# Patient Record
Sex: Male | Born: 1970
Health system: Southern US, Community
[De-identification: ages and names within clinical notes are randomized; demographics above are authoritative.]

## PROBLEM LIST (undated history)

## (undated) DIAGNOSIS — F329 Major depressive disorder, single episode, unspecified: Secondary | ICD-10-CM

## (undated) DIAGNOSIS — F32A Depression, unspecified: Secondary | ICD-10-CM

## (undated) DIAGNOSIS — F419 Anxiety disorder, unspecified: Secondary | ICD-10-CM

## (undated) HISTORY — DX: Depression, unspecified: F32.A

## (undated) HISTORY — DX: Anxiety disorder, unspecified: F41.9

## (undated) HISTORY — PX: OTHER SURGICAL HISTORY: SHX169

---
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
## 2010-05-27 ENCOUNTER — Observation Stay (HOSPITAL_COMMUNITY)
Admission: EM | Admit: 2010-05-27 | Discharge: 2010-05-29 | Payer: Self-pay | Source: Home / Self Care | Admitting: Emergency Medicine

## 2010-10-26 LAB — CBC
HCT: 40.8 % (ref 39.0–52.0)
Hemoglobin: 13.6 g/dL (ref 13.0–17.0)
MCH: 30.2 pg (ref 26.0–34.0)
MCHC: 33.3 g/dL (ref 30.0–36.0)
MCV: 90.5 fL (ref 78.0–100.0)
Platelets: 246 10*3/uL (ref 150–400)
RBC: 4.51 MIL/uL (ref 4.22–5.81)
RDW: 13.1 % (ref 11.5–15.5)
WBC: 4.8 10*3/uL (ref 4.0–10.5)

## 2010-10-26 LAB — URIC ACID: Uric Acid, Serum: 4.1 mg/dL (ref 4.0–7.8)

## 2010-10-27 LAB — CBC
HCT: 38.4 % — ABNORMAL LOW (ref 39.0–52.0)
Hemoglobin: 13.1 g/dL (ref 13.0–17.0)
MCH: 30.8 pg (ref 26.0–34.0)
MCHC: 34.1 g/dL (ref 30.0–36.0)
MCV: 90.4 fL (ref 78.0–100.0)
Platelets: 253 10*3/uL (ref 150–400)
RBC: 4.25 MIL/uL (ref 4.22–5.81)
RDW: 13.1 % (ref 11.5–15.5)
WBC: 6.8 10*3/uL (ref 4.0–10.5)

## 2010-10-27 LAB — BASIC METABOLIC PANEL
BUN: 13 mg/dL (ref 6–23)
CO2: 26 mEq/L (ref 19–32)
Calcium: 9.2 mg/dL (ref 8.4–10.5)
Chloride: 109 mEq/L (ref 96–112)
Creatinine, Ser: 1.38 mg/dL (ref 0.4–1.5)
GFR calc Af Amer: >60 mL/min (ref >60)
GFR calc non Af Amer: 57 mL/min — ABNORMAL LOW (ref >60)
Glucose, Bld: 97 mg/dL (ref 70–99)
Potassium: 4 mEq/L (ref 3.5–5.1)
Sodium: 141 mEq/L (ref 135–145)

## 2010-10-27 LAB — DIFFERENTIAL
Basophils Absolute: 0 10*3/uL (ref 0.0–0.1)
Basophils Relative: 1 % (ref 0–1)
Eosinophils Absolute: 0.2 10*3/uL (ref 0.0–0.7)
Eosinophils Relative: 2 % (ref 0–5)
Lymphocytes Relative: 26 % (ref 12–46)
Lymphs Abs: 1.7 10*3/uL (ref 0.7–4.0)
Monocytes Absolute: 0.7 10*3/uL (ref 0.1–1.0)
Monocytes Relative: 11 % (ref 3–12)
Neutro Abs: 4.1 10*3/uL (ref 1.7–7.7)
Neutrophils Relative %: 61 % (ref 43–77)

## 2011-08-02 IMAGING — CR DG HAND COMPLETE 3+V*R*
3 series · 3 of 3 positions shown · non-contrast
Comparison: None.

CLINICAL DATA: Abrasion with cellulitis at first through third
digits

RIGHT HAND - COMPLETE 3+ VIEW

[x hand pa right]
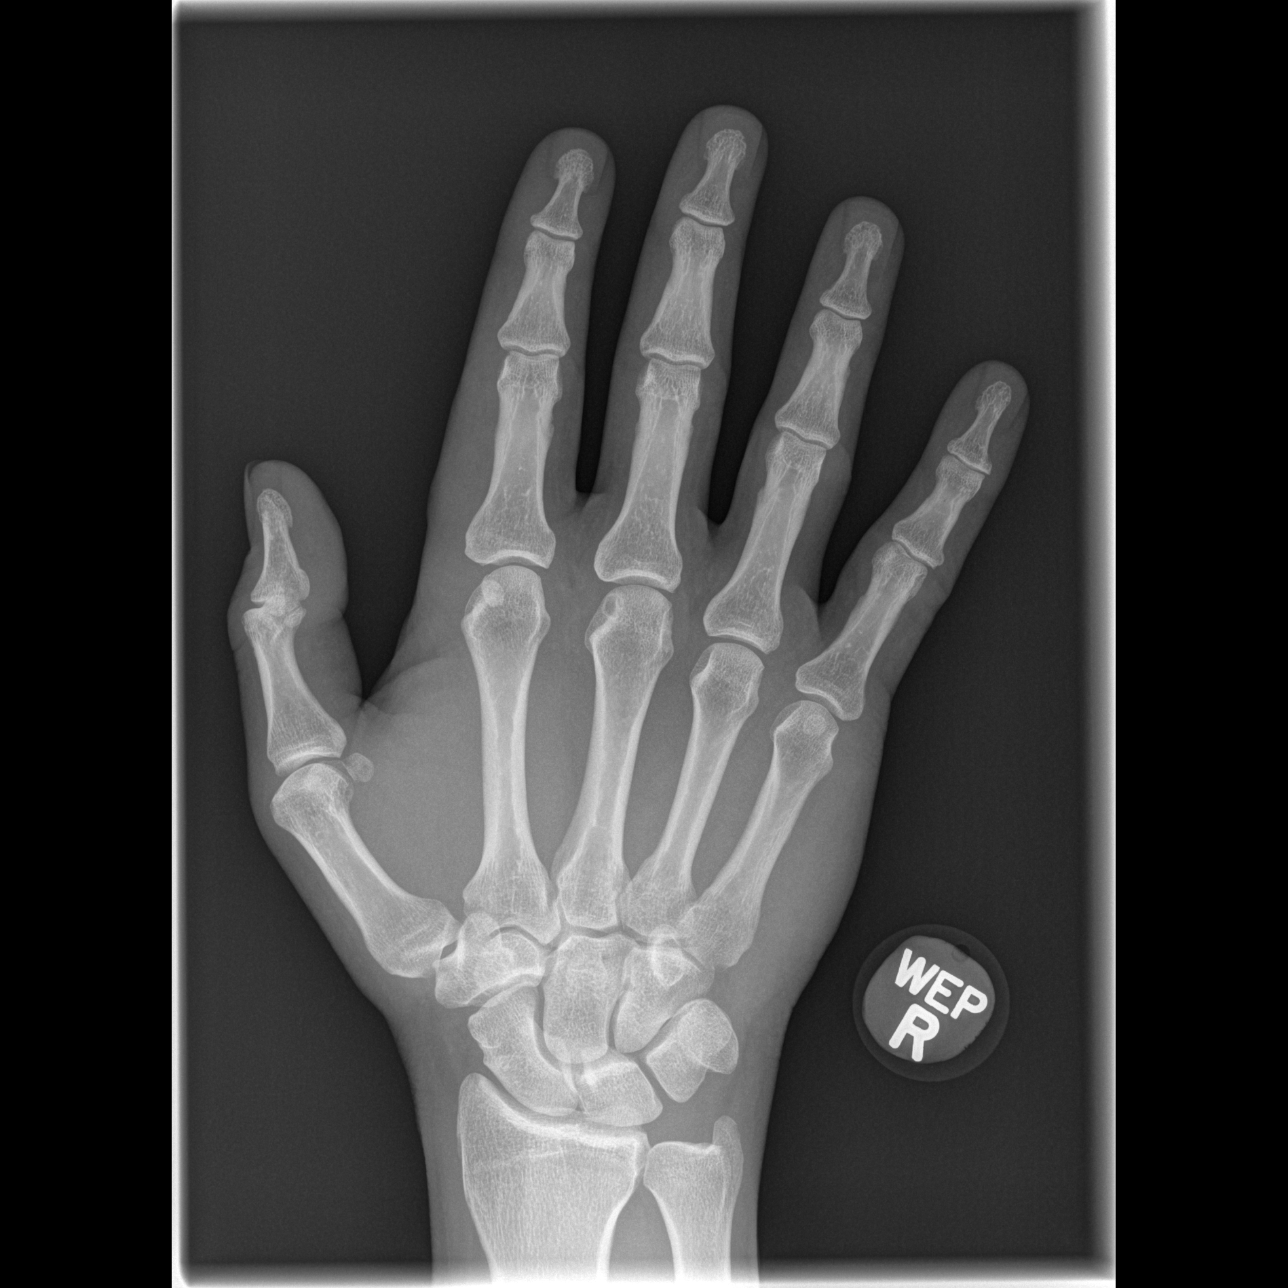

[x hand oblique right]
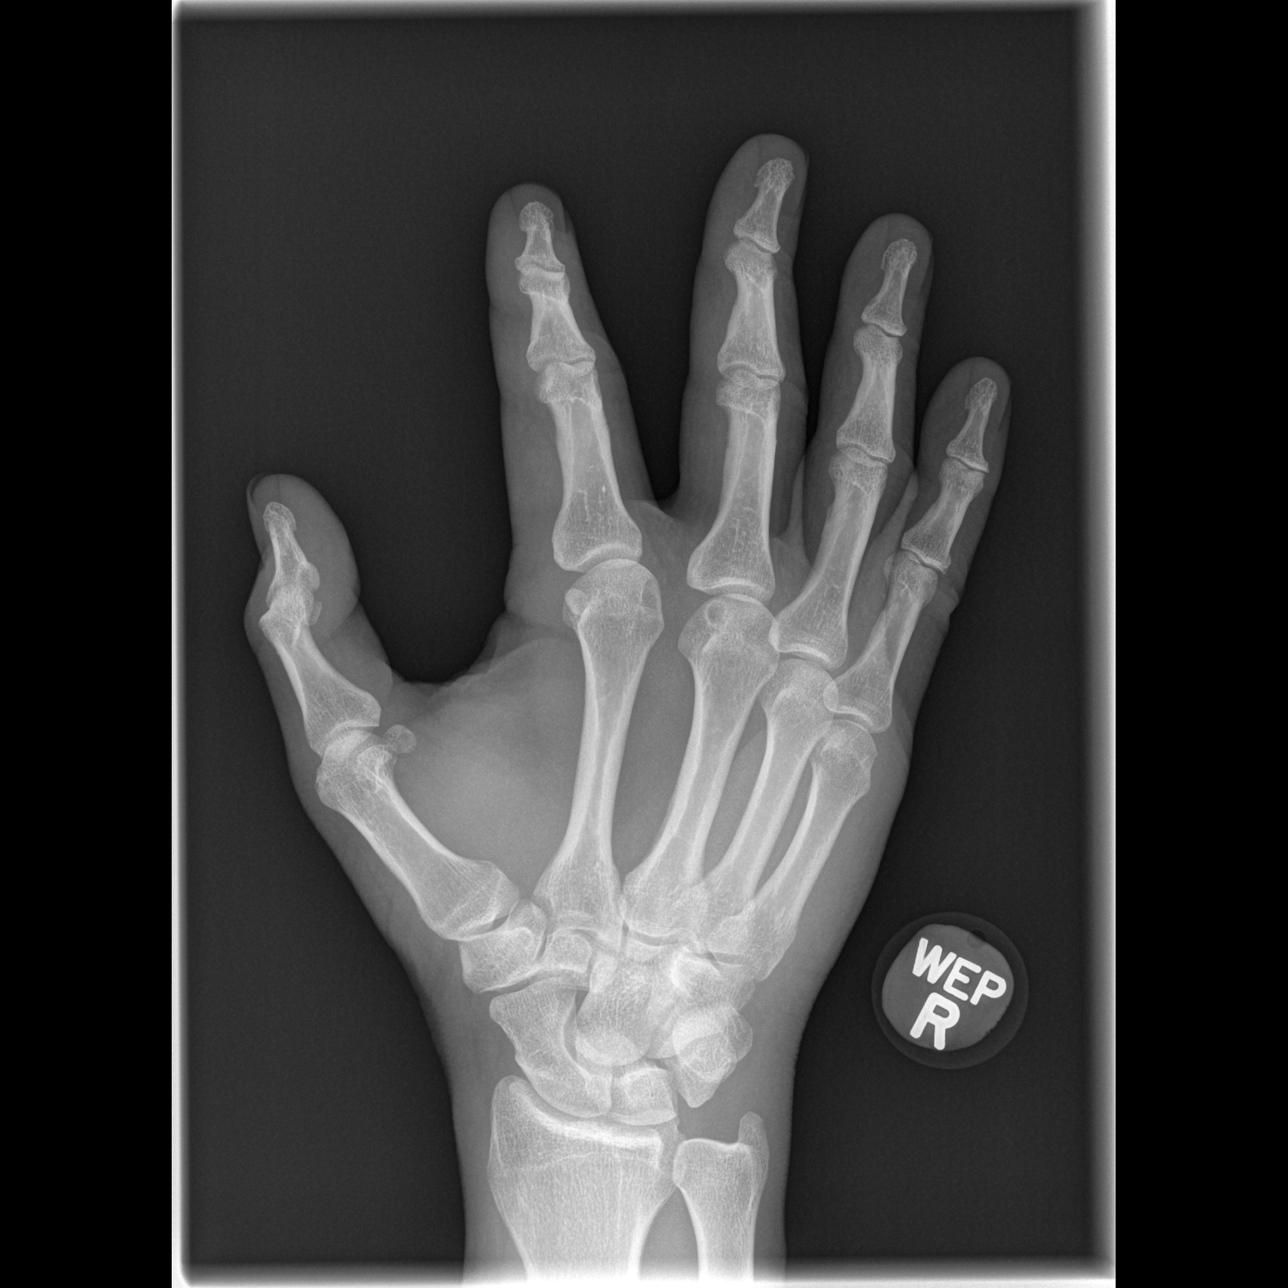

[x hand lat right]
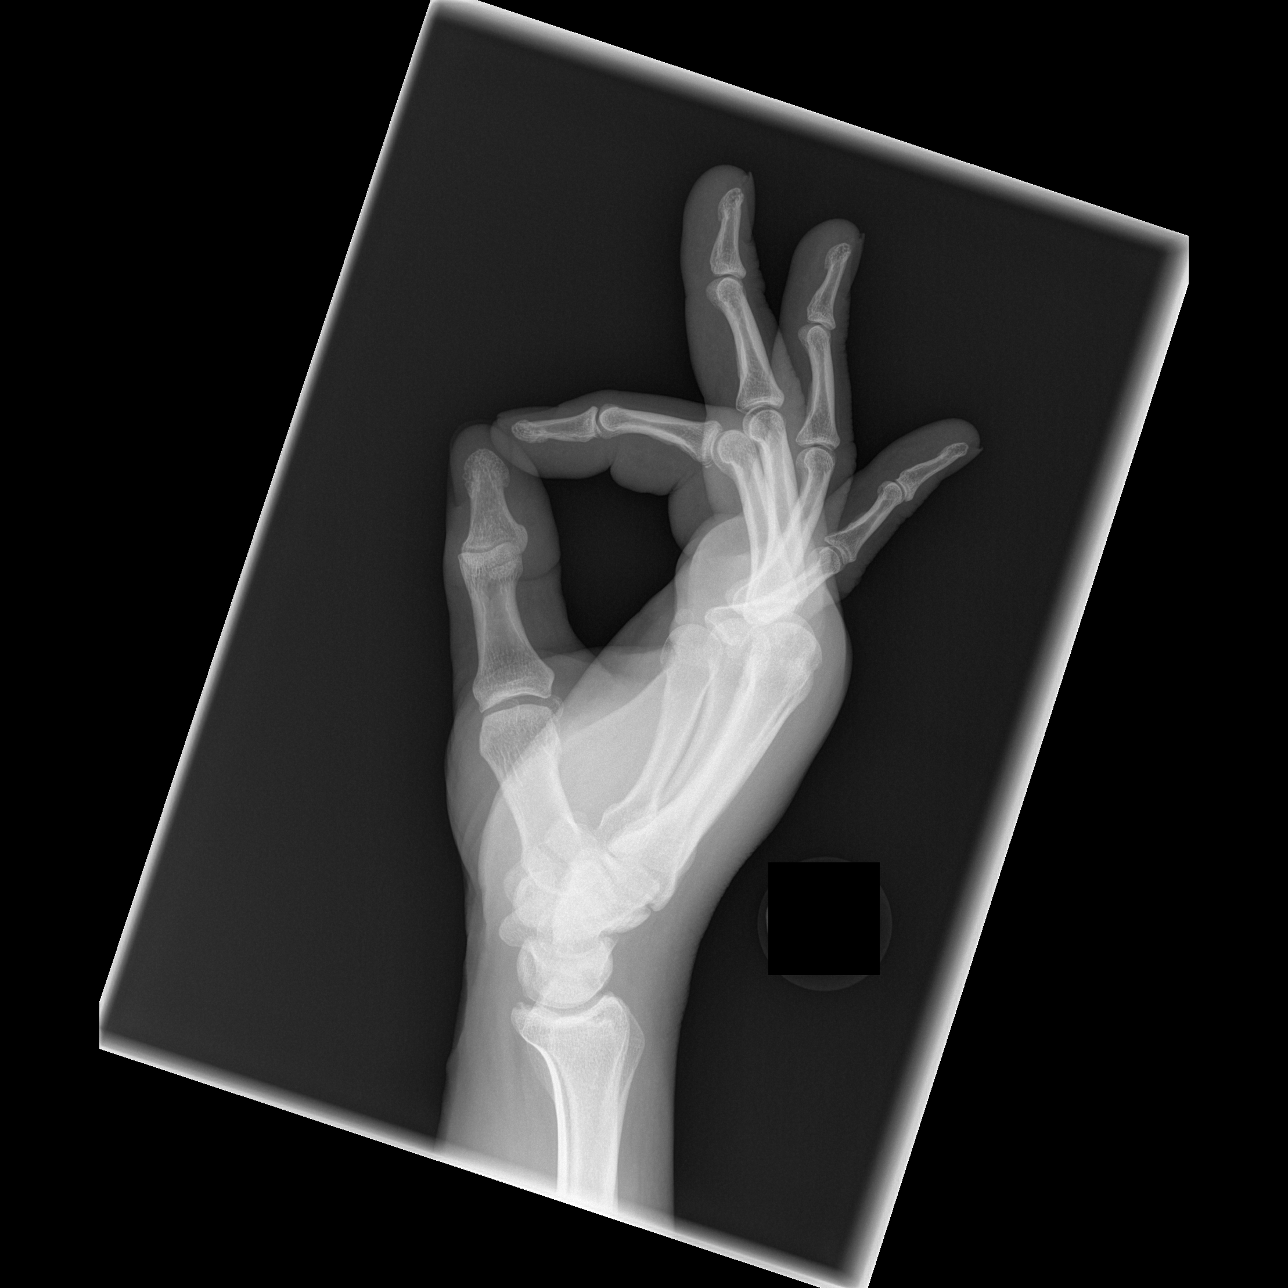

[3 of 3 positions shown; findings below may reference images not displayed]

FINDINGS: There is soft tissue swelling.  No soft tissue gas is
identified.  There are no radiopaque foreign bodies within the soft
tissues.  An old fracture is suspected at the fourth proximal
phalanx.  No acute fractures or dislocations are identified.
IMPRESSION: Soft tissue swelling with no evidence of gross soft tissue gas or
radiopaque foreign bodies .

## 2016-08-14 HISTORY — PX: KNEE SURGERY: SHX244

## 2016-08-15 DIAGNOSIS — M25661 Stiffness of right knee, not elsewhere classified: Secondary | ICD-10-CM | POA: Diagnosis not present

## 2016-08-15 DIAGNOSIS — M6281 Muscle weakness (generalized): Secondary | ICD-10-CM | POA: Diagnosis not present

## 2016-08-15 DIAGNOSIS — M25561 Pain in right knee: Secondary | ICD-10-CM | POA: Diagnosis not present

## 2016-08-22 DIAGNOSIS — M25561 Pain in right knee: Secondary | ICD-10-CM | POA: Diagnosis not present

## 2016-08-22 DIAGNOSIS — M25661 Stiffness of right knee, not elsewhere classified: Secondary | ICD-10-CM | POA: Diagnosis not present

## 2016-08-22 DIAGNOSIS — M6281 Muscle weakness (generalized): Secondary | ICD-10-CM | POA: Diagnosis not present

## 2016-09-18 DIAGNOSIS — M1711 Unilateral primary osteoarthritis, right knee: Secondary | ICD-10-CM | POA: Diagnosis not present

## 2016-10-05 DIAGNOSIS — S91302A Unspecified open wound, left foot, initial encounter: Secondary | ICD-10-CM | POA: Diagnosis not present

## 2016-10-05 DIAGNOSIS — L03116 Cellulitis of left lower limb: Secondary | ICD-10-CM | POA: Diagnosis not present

## 2016-10-05 DIAGNOSIS — S91312A Laceration without foreign body, left foot, initial encounter: Secondary | ICD-10-CM | POA: Diagnosis not present

## 2016-11-08 DIAGNOSIS — R12 Heartburn: Secondary | ICD-10-CM | POA: Diagnosis not present

## 2016-11-08 DIAGNOSIS — R5383 Other fatigue: Secondary | ICD-10-CM | POA: Diagnosis not present

## 2016-12-13 DIAGNOSIS — Z0000 Encounter for general adult medical examination without abnormal findings: Secondary | ICD-10-CM | POA: Diagnosis not present

## 2017-01-10 DIAGNOSIS — R5383 Other fatigue: Secondary | ICD-10-CM | POA: Diagnosis not present

## 2017-06-20 DIAGNOSIS — Z23 Encounter for immunization: Secondary | ICD-10-CM | POA: Diagnosis not present

## 2017-06-20 DIAGNOSIS — K580 Irritable bowel syndrome with diarrhea: Secondary | ICD-10-CM | POA: Diagnosis not present

## 2017-06-20 DIAGNOSIS — E782 Mixed hyperlipidemia: Secondary | ICD-10-CM | POA: Diagnosis not present

## 2017-06-20 DIAGNOSIS — R5383 Other fatigue: Secondary | ICD-10-CM | POA: Diagnosis not present

## 2017-06-20 DIAGNOSIS — R799 Abnormal finding of blood chemistry, unspecified: Secondary | ICD-10-CM | POA: Diagnosis not present

## 2017-07-09 DIAGNOSIS — J060 Acute laryngopharyngitis: Secondary | ICD-10-CM | POA: Diagnosis not present

## 2017-09-24 DIAGNOSIS — J060 Acute laryngopharyngitis: Secondary | ICD-10-CM | POA: Diagnosis not present

## 2017-12-13 DIAGNOSIS — R5383 Other fatigue: Secondary | ICD-10-CM | POA: Diagnosis not present

## 2017-12-13 DIAGNOSIS — L03314 Cellulitis of groin: Secondary | ICD-10-CM | POA: Diagnosis not present

## 2018-01-03 DIAGNOSIS — R2241 Localized swelling, mass and lump, right lower limb: Secondary | ICD-10-CM | POA: Diagnosis not present

## 2018-01-14 DIAGNOSIS — M799 Soft tissue disorder, unspecified: Secondary | ICD-10-CM | POA: Diagnosis not present

## 2018-01-14 DIAGNOSIS — R2241 Localized swelling, mass and lump, right lower limb: Secondary | ICD-10-CM | POA: Diagnosis not present

## 2018-01-16 DIAGNOSIS — M79671 Pain in right foot: Secondary | ICD-10-CM | POA: Diagnosis not present

## 2018-01-31 DIAGNOSIS — Z6823 Body mass index (BMI) 23.0-23.9, adult: Secondary | ICD-10-CM | POA: Diagnosis not present

## 2018-01-31 DIAGNOSIS — Z0000 Encounter for general adult medical examination without abnormal findings: Secondary | ICD-10-CM | POA: Diagnosis not present

## 2018-03-06 DIAGNOSIS — G5601 Carpal tunnel syndrome, right upper limb: Secondary | ICD-10-CM | POA: Diagnosis not present

## 2018-03-13 DIAGNOSIS — R21 Rash and other nonspecific skin eruption: Secondary | ICD-10-CM | POA: Diagnosis not present

## 2018-03-13 DIAGNOSIS — R0902 Hypoxemia: Secondary | ICD-10-CM | POA: Diagnosis not present

## 2018-03-13 DIAGNOSIS — T7840XA Allergy, unspecified, initial encounter: Secondary | ICD-10-CM | POA: Diagnosis not present

## 2018-03-13 DIAGNOSIS — T782XXA Anaphylactic shock, unspecified, initial encounter: Secondary | ICD-10-CM | POA: Diagnosis not present

## 2018-03-13 DIAGNOSIS — L509 Urticaria, unspecified: Secondary | ICD-10-CM | POA: Diagnosis not present

## 2018-03-18 DIAGNOSIS — R21 Rash and other nonspecific skin eruption: Secondary | ICD-10-CM | POA: Diagnosis not present

## 2018-03-18 DIAGNOSIS — R5383 Other fatigue: Secondary | ICD-10-CM | POA: Diagnosis not present

## 2018-03-24 DIAGNOSIS — F17210 Nicotine dependence, cigarettes, uncomplicated: Secondary | ICD-10-CM | POA: Diagnosis not present

## 2018-03-24 DIAGNOSIS — R079 Chest pain, unspecified: Secondary | ICD-10-CM | POA: Diagnosis not present

## 2018-03-24 DIAGNOSIS — R2689 Other abnormalities of gait and mobility: Secondary | ICD-10-CM | POA: Diagnosis not present

## 2018-03-24 DIAGNOSIS — H8110 Benign paroxysmal vertigo, unspecified ear: Secondary | ICD-10-CM | POA: Diagnosis not present

## 2018-03-24 DIAGNOSIS — R42 Dizziness and giddiness: Secondary | ICD-10-CM | POA: Diagnosis not present

## 2018-03-27 ENCOUNTER — Ambulatory Visit (INDEPENDENT_AMBULATORY_CARE_PROVIDER_SITE_OTHER): Payer: 59 | Admitting: Neurology

## 2018-03-27 DIAGNOSIS — G5601 Carpal tunnel syndrome, right upper limb: Secondary | ICD-10-CM

## 2018-03-27 DIAGNOSIS — Z0289 Encounter for other administrative examinations: Secondary | ICD-10-CM

## 2018-03-27 NOTE — Procedures (Signed)
Full Name: Robert Mcneil Gender: Male MRN #: [id number] Date of Birth: 2071/05/19    Visit Date: 03/27/18 10:53 Age: 47 Years 40 Months Old Examining Physician: Marcial Pacas, MD  Referring Physician: Dirk Dress, MD History: 47 year old male, with history of bilateral carpal tunnel release surgery in the past, presented with recurrent right hand paresthesia.  He has right wrist Tinel sign on examination  Summary of the tests:  Nerve conduction study: Bilateral median sensory responses showed borderline or mildly prolonged peak latency, right side also has mildly decreased snap amplitude. Bilateral median and left ulnar motor responses were normal.  Electromyography: Selective needle examination of right upper extremity and right cervical paraspinal muscles were normal.  Conclusion: This is an abnormal study.  There is electrodiagnostic evidence of mild right carpal tunnel syndrome, there is no evidence of right cervical radiculopathy.    ------------------------------- Marcial Pacas, M.D. PhD  [facility] [place], [place] [address] Tel: [phone] Fax: [phone]        [facility]    Nerve / Sites Muscle Latency Ref. Amplitude Ref. Rel Amp Segments Distance Velocity Ref. Area    ms ms mV mV %  cm m/s m/s mVms  L Median - APB     Wrist APB 3.7 ?4.4 8.3 ?4.0 100 Wrist - APB 7   33.6     Upper arm APB 8.4  8.5  102 Upper arm - Wrist 24 51 ?49 35.1  R Median - APB     Wrist APB 3.8 ?4.4 7.9 ?4.0 100 Wrist - APB 7   32.7     Upper arm APB 8.5  7.7  98.1 Upper arm - Wrist 24 51 ?49 31.3  L Ulnar - ADM     Wrist ADM 2.7 ?3.3 10.9 ?6.0 100 Wrist - ADM 7   36.3     B.Elbow ADM 6.9  10.3  94.8 B.Elbow - Wrist 22 53 ?49 35.6     A.Elbow ADM 8.7  10.2  98.5 A.Elbow - B.Elbow 10 55 ?49 35.0         A.Elbow - Wrist               SNC    Nerve / Sites Rec. Site Peak Lat Ref.  Amp Ref. Segments Distance    ms ms V V  cm  L Median - Orthodromic  (Dig II, Mid palm)     Dig II Wrist 3.4 ?3.4 11 ?10 Dig II - Wrist 13  R Median - Orthodromic (Dig II, Mid palm)     Dig II Wrist 3.6 ?3.4 7 ?10 Dig II - Wrist 13  L Ulnar - Orthodromic, (Dig V, Mid palm)     Dig V Wrist 3.0 ?3.1 5 ?5 Dig V - Wrist 11  R Ulnar - Orthodromic, (Dig V, Mid palm)     Dig V Wrist 2.8 ?3.1 7 ?5 Dig V - Wrist 64                   F  Wave    Nerve F Lat Ref.   ms ms  L Ulnar - ADM 31.8 ?32.0       EMG full       EMG Summary Table    Spontaneous MUAP Recruitment  Muscle IA Fib PSW Fasc Other Amp Dur. Poly Pattern  R. Abductor pollicis brevis Normal None None None _______ Normal Normal Normal Normal  R. First dorsal  interosseous Normal None None None _______ Normal Normal Normal Normal  R. Pronator teres Normal None None None _______ Normal Normal Normal Normal  R. Deltoid Normal None None None _______ Normal Normal Normal Normal  R. Biceps brachii Normal None None None _______ Normal Normal Normal Normal  R. Extensor digitorum communis Normal None None None _______ Normal Normal Normal Normal  R. Brachioradialis Normal None None None _______ Normal Normal Normal Normal  R. Cervical paraspinals Normal None None None _______ Normal Normal Normal Normal

## 2018-03-27 NOTE — Progress Notes (Signed)
See  Emg/ncs report under procedure

## 2018-07-25 DIAGNOSIS — Z23 Encounter for immunization: Secondary | ICD-10-CM | POA: Diagnosis not present

## 2018-07-25 DIAGNOSIS — K580 Irritable bowel syndrome with diarrhea: Secondary | ICD-10-CM | POA: Diagnosis not present

## 2018-08-16 DIAGNOSIS — S20221A Contusion of right back wall of thorax, initial encounter: Secondary | ICD-10-CM | POA: Diagnosis not present

## 2018-08-16 DIAGNOSIS — R918 Other nonspecific abnormal finding of lung field: Secondary | ICD-10-CM | POA: Diagnosis not present

## 2018-08-16 DIAGNOSIS — M549 Dorsalgia, unspecified: Secondary | ICD-10-CM | POA: Diagnosis not present

## 2018-08-16 DIAGNOSIS — M1990 Unspecified osteoarthritis, unspecified site: Secondary | ICD-10-CM | POA: Diagnosis not present

## 2018-08-16 DIAGNOSIS — S299XXA Unspecified injury of thorax, initial encounter: Secondary | ICD-10-CM | POA: Diagnosis not present

## 2018-08-19 DIAGNOSIS — M546 Pain in thoracic spine: Secondary | ICD-10-CM | POA: Diagnosis not present

## 2018-08-28 DIAGNOSIS — M7541 Impingement syndrome of right shoulder: Secondary | ICD-10-CM | POA: Diagnosis not present

## 2018-09-04 DIAGNOSIS — M7540 Impingement syndrome of unspecified shoulder: Secondary | ICD-10-CM | POA: Diagnosis not present

## 2018-09-04 DIAGNOSIS — M6281 Muscle weakness (generalized): Secondary | ICD-10-CM | POA: Diagnosis not present

## 2018-10-08 DIAGNOSIS — M5412 Radiculopathy, cervical region: Secondary | ICD-10-CM | POA: Diagnosis not present

## 2018-10-08 DIAGNOSIS — M7540 Impingement syndrome of unspecified shoulder: Secondary | ICD-10-CM | POA: Diagnosis not present

## 2018-10-11 DIAGNOSIS — M4802 Spinal stenosis, cervical region: Secondary | ICD-10-CM | POA: Diagnosis not present

## 2018-10-11 DIAGNOSIS — M5412 Radiculopathy, cervical region: Secondary | ICD-10-CM | POA: Diagnosis not present

## 2018-10-15 DIAGNOSIS — M5412 Radiculopathy, cervical region: Secondary | ICD-10-CM | POA: Diagnosis not present

## 2018-10-18 DIAGNOSIS — M50122 Cervical disc disorder at C5-C6 level with radiculopathy: Secondary | ICD-10-CM | POA: Diagnosis not present

## 2018-10-18 DIAGNOSIS — M5412 Radiculopathy, cervical region: Secondary | ICD-10-CM | POA: Diagnosis not present

## 2018-10-18 DIAGNOSIS — M50123 Cervical disc disorder at C6-C7 level with radiculopathy: Secondary | ICD-10-CM | POA: Diagnosis not present

## 2018-11-15 DIAGNOSIS — M5412 Radiculopathy, cervical region: Secondary | ICD-10-CM | POA: Diagnosis not present

## 2018-12-17 DIAGNOSIS — M5030 Other cervical disc degeneration, unspecified cervical region: Secondary | ICD-10-CM | POA: Diagnosis not present

## 2018-12-17 DIAGNOSIS — G894 Chronic pain syndrome: Secondary | ICD-10-CM | POA: Diagnosis not present

## 2019-10-22 ENCOUNTER — Ambulatory Visit (INDEPENDENT_AMBULATORY_CARE_PROVIDER_SITE_OTHER): Payer: 59 | Admitting: Physician Assistant

## 2019-10-22 ENCOUNTER — Other Ambulatory Visit: Payer: Self-pay

## 2019-10-22 ENCOUNTER — Encounter: Payer: Self-pay | Admitting: Physician Assistant

## 2019-10-22 VITALS — BP 118/64 | HR 70 | Temp 97.6°F | Resp 16 | Wt 176.0 lb

## 2019-10-22 DIAGNOSIS — M4802 Spinal stenosis, cervical region: Secondary | ICD-10-CM

## 2019-10-22 MED ORDER — IBUPROFEN 800 MG PO TABS: 800.0000 mg | ORAL_TABLET | Freq: Three times a day (TID) | ORAL | 1 refills | Status: DC | PRN

## 2019-10-22 NOTE — Progress Notes (Signed)
Acute Office Visit  Subjective:    Patient ID: Robert Mcneil, male    DOB: 06-01-1971, 49 y.o.   MRN: [id number]  Chief Complaint  Patient presents with  . Neck Pain    HPI Patient is in today for chronic neck pain Patient states that he has had trouble with his neck and arm for over a year - he had an injury that started his symptoms about a year ago he fell while carrying a pool table which caused neck and shoulder pain He was seen and followed with ortho last year and had been sent to physical therapy as well as having ortho do an injection in his neck However his symptoms have persisted with neck and shoulder discomfort and loss of strength and range of motion of his right arm He would like referral to neurosurgeon because he states he has history of impinged nerves We requested copy of MRI --- it was done 10/11/18 and indeed it shows moderate right C7 foraminal stenosis, C5-6 disc protrusion with mild stenosis  Past Medical History:  Diagnosis Date  . Anxiety   . Depression     Past Surgical History:  Procedure Laterality Date  . none      Family History  Problem Relation Age of Onset  . Breast cancer Paternal Grandmother     Social History   Socioeconomic History  . Marital status: Married    Spouse name: Not on file  . Number of children: 2  . Years of education: Not on file  . Highest education level: Not on file  Occupational History  . Occupation: Cabin crew  Tobacco Use  . Smoking status: Never Smoker  . Smokeless tobacco: Never Used  Substance and Sexual Activity  . Alcohol use: Never  . Drug use: Never  . Sexual activity: Not on file  Other Topics Concern  . Not on file  Social History Narrative  . Not on file   Social Determinants of Health   Financial Resource Strain:   . Difficulty of Paying Living Expenses: Not on file  Food Insecurity:   . Worried About Charity fundraiser in the Last Year: Not on file  . Ran Out of Food in the  Last Year: Not on file  Transportation Needs:   . Lack of Transportation (Medical): Not on file  . Lack of Transportation (Non-Medical): Not on file  Physical Activity:   . Days of Exercise per Week: Not on file  . Minutes of Exercise per Session: Not on file  Stress:   . Feeling of Stress : Not on file  Social Connections:   . Frequency of Communication with Friends and Family: Not on file  . Frequency of Social Gatherings with Friends and Family: Not on file  . Attends Religious Services: Not on file  . Active Member of Clubs or Organizations: Not on file  . Attends Archivist Meetings: Not on file  . Marital Status: Not on file  Intimate Partner Violence:   . Fear of Current or Ex-Partner: Not on file  . Emotionally Abused: Not on file  . Physically Abused: Not on file  . Sexually Abused: Not on file     Current Outpatient Medications:  .  citalopram (CELEXA) 40 MG tablet, Take 40 mg by mouth daily., Disp: , Rfl:  .  clindamycin-benzoyl peroxide (BENZACLIN) gel, APPLY THIN FILM TO AFFECTED AREA TWICE A DAY, Disp: , Rfl:  .  ibuprofen (ADVIL) 800 MG tablet, Take  1 tablet (800 mg total) by mouth every 8 (eight) hours as needed., Disp: 90 tablet, Rfl: 1 .  LORazepam (ATIVAN) 0.5 MG tablet, Take 0.5 mg by mouth daily as needed., Disp: , Rfl:  .  meclizine (ANTIVERT) 25 MG tablet, Take 25 mg by mouth 3 (three) times daily as needed., Disp: , Rfl:  .  sildenafil (VIAGRA) 50 MG tablet, SMARTSIG:1 Tablet(s) By Mouth As Needed, Disp: , Rfl:  .  SUMAtriptan (IMITREX) 100 MG tablet, Take 100 mg by mouth 2 (two) times daily as needed., Disp: , Rfl:    No Known Allergies  CONSTITUTIONAL: Negative for chills, fatigue, fever, unintentional weight gain and unintentional weight loss.   CARDIOVASCULAR: Negative for chest pain, dizziness, palpitations and pedal edema.  RESPIRATORY: Negative for recent cough and dyspnea.   MSK: see HPI INTEGUMENTARY: Negative for rash.    NEUROLOGICAL: see HPI PSYCHIATRIC: Negative for sleep disturbance and to question depression screen.  Negative for depression, negative for anhedonia.         Objective:    PHYSICAL EXAM:   VS: BP 118/64   Pulse 70   Temp 97.6 F (36.4 C)   Resp 16   Wt 176 lb (79.8 kg)   SpO2 97%   GEN: Well nourished, well developed, in no acute distress   Cardiac: RRR; no murmurs, rubs, or gallops,no edema - no significant varicosities Respiratory:  normal respiratory rate and pattern with no distress - normal breath sounds with no rales, rhonchi, wheezes or rubs  MS: tenderness to back of neck and upper shoulder - decreased rom and strenght in right arm noted  Skin: warm and dry, no rash  Neuro:  Alert and Oriented x 3, Strength and sensation are intact - CN II-Xii grossly intact Psych: euthymic mood, appropriate affect and demeanor   Wt Readings from Last 3 Encounters:  10/22/19 176 lb (79.8 kg)    Health Maintenance Due  Topic Date Due  . HIV Screening  04/28/1984  . TETANUS/TDAP  04/28/1988  . INFLUENZA VACCINE  03/15/2019  . COLONOSCOPY  04/29/2019    There are no preventive care reminders to display for this patient.        Assessment & Plan:   Problem List Items Addressed This Visit      Other   Spinal stenosis of cervical region - Primary       Meds ordered this encounter  Medications  . ibuprofen (ADVIL) 800 MG tablet    Sig: Take 1 tablet (800 mg total) by mouth every 8 (eight) hours as needed.    Dispense:  90 tablet    Refill:  1    Order Specific Question:   Supervising Provider    Answer:   COX, KIRSTEN [id number]     [place], PA-C

## 2019-10-22 NOTE — Assessment & Plan Note (Signed)
Refill of ibuprofen given Will refer to neurosurgeon

## 2019-11-28 ENCOUNTER — Telehealth: Payer: Self-pay

## 2019-11-28 NOTE — Telephone Encounter (Signed)
Patient called in today, has not heard from neurosugery yet, wondering where we are at in referral process

## 2020-01-26 ENCOUNTER — Encounter: Payer: Self-pay | Admitting: Physician Assistant

## 2020-01-26 ENCOUNTER — Ambulatory Visit (INDEPENDENT_AMBULATORY_CARE_PROVIDER_SITE_OTHER): Payer: 59 | Admitting: Physician Assistant

## 2020-01-26 ENCOUNTER — Other Ambulatory Visit: Payer: Self-pay | Admitting: Physician Assistant

## 2020-01-26 ENCOUNTER — Other Ambulatory Visit: Payer: Self-pay

## 2020-01-26 VITALS — BP 110/72 | HR 72 | Temp 97.9°F | Ht 72.0 in | Wt 175.0 lb

## 2020-01-26 DIAGNOSIS — Z8669 Personal history of other diseases of the nervous system and sense organs: Secondary | ICD-10-CM | POA: Diagnosis not present

## 2020-01-26 DIAGNOSIS — E782 Mixed hyperlipidemia: Secondary | ICD-10-CM | POA: Diagnosis not present

## 2020-01-26 DIAGNOSIS — F419 Anxiety disorder, unspecified: Secondary | ICD-10-CM | POA: Diagnosis not present

## 2020-01-26 DIAGNOSIS — Z23 Encounter for immunization: Secondary | ICD-10-CM | POA: Insufficient documentation

## 2020-01-26 LAB — CBC WITH DIFFERENTIAL/PLATELET
Basophils Absolute: 0.1 10*3/uL (ref 0.0–0.2)
Basos: 1 %
EOS (ABSOLUTE): 0.5 10*3/uL — ABNORMAL HIGH (ref 0.0–0.4)
Eos: 7 %
Hematocrit: 42.4 % (ref 37.5–51.0)
Hemoglobin: 14.3 g/dL (ref 13.0–17.7)
Immature Grans (Abs): 0 10*3/uL (ref 0.0–0.1)
Immature Granulocytes: 0 %
Lymphocytes Absolute: 1.9 10*3/uL (ref 0.7–3.1)
Lymphs: 29 %
MCH: 31 pg (ref 26.6–33.0)
MCHC: 33.7 g/dL (ref 31.5–35.7)
MCV: 92 fL (ref 79–97)
Monocytes Absolute: 0.9 10*3/uL (ref 0.1–0.9)
Monocytes: 14 %
Neutrophils Absolute: 3.1 10*3/uL (ref 1.4–7.0)
Neutrophils: 49 %
Platelets: 263 10*3/uL (ref 150–450)
RBC: 4.61 x10E6/uL (ref 4.14–5.80)
RDW: 13.1 % (ref 11.6–15.4)
WBC: 6.4 10*3/uL (ref 3.4–10.8)

## 2020-01-26 MED ORDER — LORAZEPAM 0.5 MG PO TABS: 0.5000 mg | ORAL_TABLET | Freq: Every day | ORAL | 2 refills | Status: DC | PRN

## 2020-01-26 MED ORDER — SILDENAFIL CITRATE 50 MG PO TABS: ORAL_TABLET | ORAL | 1 refills | Status: DC

## 2020-01-26 MED ORDER — SUMATRIPTAN SUCCINATE 100 MG PO TABS: 100.0000 mg | ORAL_TABLET | Freq: Two times a day (BID) | ORAL | 1 refills | Status: DC | PRN

## 2020-01-26 MED ORDER — MECLIZINE HCL 25 MG PO TABS: 25.0000 mg | ORAL_TABLET | Freq: Three times a day (TID) | ORAL | 1 refills | Status: DC | PRN

## 2020-01-26 MED ORDER — CITALOPRAM HYDROBROMIDE 40 MG PO TABS: 40.0000 mg | ORAL_TABLET | Freq: Every day | ORAL | 1 refills | Status: DC

## 2020-01-26 MED ORDER — IBUPROFEN 800 MG PO TABS: 800.0000 mg | ORAL_TABLET | Freq: Three times a day (TID) | ORAL | 1 refills | Status: DC | PRN

## 2020-01-26 NOTE — Assessment & Plan Note (Signed)
Continue current meds as directed 

## 2020-01-26 NOTE — Assessment & Plan Note (Signed)
Continue meds as directed

## 2020-01-26 NOTE — Assessment & Plan Note (Signed)
Tdap given.  

## 2020-01-26 NOTE — Assessment & Plan Note (Signed)
Well controlled.   Continue to work on eating a healthy diet and exercise.  Labs drawn today.   

## 2020-01-26 NOTE — Progress Notes (Signed)
Established Patient Office Visit  Subjective:  Patient ID: Robert Mcneil, male    DOB: Jan 18, 1971  Age: 49 y.o. MRN: [id number]  CC:  Chief Complaint  Patient presents with  . Medication Refill    6 months fasting    HPI [name] presents for follow up migraines  Pt has a history of migraines - does no have that often but when he does he uses imitrex 100mg  which relieves symptoms  Pt with history of hyperlipidemia - he is not on medications but does watch diet  Pt with history of anxiety - states he is in a new relationship and doing well - he is currently on celexa 40mg  qd, lorazepam 0.5mg  prn   Pt is overdue for tetanus booster - agreeable to receive today  Past Medical History:  Diagnosis Date  . Anxiety   . Depression     Past Surgical History:  Procedure Laterality Date  . none      Family History  Problem Relation Age of Onset  . Breast cancer Paternal Grandmother     Social History   Socioeconomic History  . Marital status: Married    Spouse name: Not on file  . Number of children: 2  . Years of education: Not on file  . Highest education level: Not on file  Occupational History  . Occupation: Cabin crew  Tobacco Use  . Smoking status: Never Smoker  . Smokeless tobacco: Never Used  Vaping Use  . Vaping Use: Never used  Substance and Sexual Activity  . Alcohol use: Never  . Drug use: Never  . Sexual activity: Not on file  Other Topics Concern  . Not on file  Social History Narrative  . Not on file   Social Determinants of Health   Financial Resource Strain:   . Difficulty of Paying Living Expenses:   Food Insecurity:   . Worried About Charity fundraiser in the Last Year:   . Arboriculturist in the Last Year:   Transportation Needs:   . Film/video editor (Medical):   Marland Kitchen Lack of Transportation (Non-Medical):   Physical Activity:   . Days of Exercise per Week:   . Minutes of Exercise per Session:   Stress:   .  Feeling of Stress :   Social Connections:   . Frequency of Communication with Friends and Family:   . Frequency of Social Gatherings with Friends and Family:   . Attends Religious Services:   . Active Member of Clubs or Organizations:   . Attends Archivist Meetings:   Marland Kitchen Marital Status:   Intimate Partner Violence:   . Fear of Current or Ex-Partner:   . Emotionally Abused:   Marland Kitchen Physically Abused:   . Sexually Abused:      Current Outpatient Medications:  .  citalopram (CELEXA) 40 MG tablet, Take 1 tablet (40 mg total) by mouth daily., Disp: 90 tablet, Rfl: 1 .  ibuprofen (ADVIL) 800 MG tablet, Take 1 tablet (800 mg total) by mouth every 8 (eight) hours as needed., Disp: 90 tablet, Rfl: 1 .  LORazepam (ATIVAN) 0.5 MG tablet, Take 1 tablet (0.5 mg total) by mouth daily as needed., Disp: 30 tablet, Rfl: 2 .  meclizine (ANTIVERT) 25 MG tablet, Take 1 tablet (25 mg total) by mouth 3 (three) times daily as needed., Disp: 90 tablet, Rfl: 1 .  sildenafil (VIAGRA) 50 MG tablet, SMARTSIG:1 Tablet(s) By Mouth As Needed, Disp: 30 tablet, Rfl: 1 .  SUMAtriptan (IMITREX) 100 MG tablet, Take 1 tablet (100 mg total) by mouth 2 (two) times daily as needed., Disp: 30 tablet, Rfl: 1   No Known Allergies  ROS CONSTITUTIONAL: Negative for chills, fatigue, fever, unintentional weight gain and unintentional weight loss.  E/N/T: Negative for ear pain, nasal congestion and sore throat.  CARDIOVASCULAR: Negative for chest pain, dizziness, palpitations and pedal edema.  RESPIRATORY: Negative for recent cough and dyspnea.  GASTROINTESTINAL: Negative for abdominal pain, acid reflux symptoms, constipation, diarrhea, nausea and vomiting.  MSK: Negative for arthralgias and myalgias.  INTEGUMENTARY: Negative for rash.  NEUROLOGICAL: Negative for dizziness and headaches.  PSYCHIATRIC: Negative for sleep disturbance and to question depression screen.  Negative for depression, negative for anhedonia.         Objective:    PHYSICAL EXAM:   VS: BP 110/72 (BP Location: Left Arm, Patient Position: Sitting)   Pulse 72   Temp 97.9 F (36.6 C) (Temporal)   Ht 6' (1.829 m)   Wt 175 lb (79.4 kg)   SpO2 96%   BMI 23.73 kg/m   GEN: Well nourished, well developed, in no acute distress   Cardiac: RRR; no murmurs, rubs, or gallops,no edema -  Respiratory:  normal respiratory rate and pattern with no distress - normal breath sounds with no rales, rhonchi, wheezes or rubs MS: no deformity or atrophy  Skin: warm and dry, no rash  Neuro:  Alert and Oriented x 3, Strength and sensation are intact - CN II-Xii grossly intact Psych: euthymic mood, appropriate affect and demeanor  BP 110/72 (BP Location: Left Arm, Patient Position: Sitting)   Pulse 72   Temp 97.9 F (36.6 C) (Temporal)   Ht 6' (1.829 m)   Wt 175 lb (79.4 kg)   SpO2 96%   BMI 23.73 kg/m  Wt Readings from Last 3 Encounters:  01/26/20 175 lb (79.4 kg)  10/22/19 176 lb (79.8 kg)     There are no preventive care reminders to display for this patient.  There are no preventive care reminders to display for this patient.  No results found for: TSH Lab Results  Component Value Date   WBC 4.8 05/29/2010   HGB 13.6 05/29/2010   HCT 40.8 05/29/2010   MCV 90.5 05/29/2010   PLT 246 05/29/2010   Lab Results  Component Value Date   NA 141 05/27/2010   K 4.0 05/27/2010   CO2 26 05/27/2010   GLUCOSE 97 05/27/2010   BUN 13 05/27/2010   CREATININE 1.38 05/27/2010   CALCIUM 9.2 05/27/2010   No results found for: CHOL No results found for: HDL No results found for: LDLCALC No results found for: TRIG No results found for: CHOLHDL No results found for: HGBA1C    Assessment & Plan:   Problem List Items Addressed This Visit      Other   Mixed hyperlipidemia    Well controlled.  Continue to work on eating a healthy diet and exercise.  Labs drawn today.        Relevant Medications   sildenafil (VIAGRA) 50 MG tablet    Other Relevant Orders   CBC with Differential/Platelet   Comprehensive metabolic panel   Lipid panel   Anxiety    Continue current meds as directed      Relevant Medications   citalopram (CELEXA) 40 MG tablet   LORazepam (ATIVAN) 0.5 MG tablet   Need for tetanus, diphtheria, and acellular pertussis (Tdap) vaccine in patient of adolescent age or older  Tdap given      Relevant Orders   Tdap vaccine greater than or equal to 7yo IM (Completed)   Hx of migraines - Primary    Continue meds as directed         Meds ordered this encounter  Medications  . citalopram (CELEXA) 40 MG tablet    Sig: Take 1 tablet (40 mg total) by mouth daily.    Dispense:  90 tablet    Refill:  1    Order Specific Question:   Supervising Provider    AnswerRochel Brome [id number]  . ibuprofen (ADVIL) 800 MG tablet    Sig: Take 1 tablet (800 mg total) by mouth every 8 (eight) hours as needed.    Dispense:  90 tablet    Refill:  1    Order Specific Question:   Supervising Provider    AnswerRochel Brome [id number]  . LORazepam (ATIVAN) 0.5 MG tablet    Sig: Take 1 tablet (0.5 mg total) by mouth daily as needed.    Dispense:  30 tablet    Refill:  2    Order Specific Question:   Supervising Provider    AnswerRochel Brome [id number]  . sildenafil (VIAGRA) 50 MG tablet    Sig: SMARTSIG:1 Tablet(s) By Mouth As Needed    Dispense:  30 tablet    Refill:  1    Order Specific Question:   Supervising Provider    AnswerShelton Silvas  . SUMAtriptan (IMITREX) 100 MG tablet    Sig: Take 1 tablet (100 mg total) by mouth 2 (two) times daily as needed.    Dispense:  30 tablet    Refill:  1    Order Specific Question:   Supervising Provider    AnswerRochel Brome [id number]  . meclizine (ANTIVERT) 25 MG tablet    Sig: Take 1 tablet (25 mg total) by mouth 3 (three) times daily as needed.    Dispense:  90 tablet    Refill:  1    Order Specific Question:   Supervising Provider     Answer:   Shelton Silvas    Follow-up: Return in about 6 months (around 07/27/2020) for chronic fasting follow up.    SARA R Chelsey Redondo, PA-C

## 2020-02-03 ENCOUNTER — Other Ambulatory Visit: Payer: Self-pay

## 2020-02-03 DIAGNOSIS — E782 Mixed hyperlipidemia: Secondary | ICD-10-CM

## 2020-02-05 ENCOUNTER — Other Ambulatory Visit: Payer: 59

## 2020-02-05 LAB — COMPREHENSIVE METABOLIC PANEL
ALT: 27 IU/L (ref 0–44)
AST: 19 IU/L (ref 0–40)
Albumin/Globulin Ratio: 2.4 — ABNORMAL HIGH (ref 1.2–2.2)
Albumin: 4.5 g/dL (ref 4.0–5.0)
Alkaline Phosphatase: 77 IU/L (ref 48–121)
BUN/Creatinine Ratio: 19 (ref 9–20)
BUN: 19 mg/dL (ref 6–24)
Bilirubin Total: 0.3 mg/dL (ref 0.0–1.2)
CO2: 23 mmol/L (ref 20–29)
Calcium: 9.1 mg/dL (ref 8.7–10.2)
Chloride: 102 mmol/L (ref 96–106)
Creatinine, Ser: 1.02 mg/dL (ref 0.76–1.27)
GFR calc Af Amer: 99 mL/min/{1.73_m2} (ref >59)
GFR calc non Af Amer: 85 mL/min/{1.73_m2} (ref >59)
Globulin, Total: 1.9 g/dL (ref 1.5–4.5)
Glucose: 92 mg/dL (ref 65–99)
Potassium: 4.3 mmol/L (ref 3.5–5.2)
Sodium: 138 mmol/L (ref 134–144)
Total Protein: 6.4 g/dL (ref 6.0–8.5)

## 2020-02-11 LAB — LIPID PANEL
Chol/HDL Ratio: 3.9 ratio (ref 0.0–5.0)
Cholesterol, Total: 156 mg/dL (ref 100–199)
HDL: 40 mg/dL (ref >39)
LDL Chol Calc (NIH): 97 mg/dL (ref 0–99)
Triglycerides: 101 mg/dL (ref 0–149)
VLDL Cholesterol Cal: 19 mg/dL (ref 5–40)

## 2020-02-11 LAB — CARDIOVASCULAR RISK ASSESSMENT

## 2020-02-11 LAB — SPECIMEN STATUS REPORT

## 2020-02-12 ENCOUNTER — Other Ambulatory Visit: Payer: Self-pay

## 2020-02-12 ENCOUNTER — Ambulatory Visit (INDEPENDENT_AMBULATORY_CARE_PROVIDER_SITE_OTHER): Payer: 59 | Admitting: Physician Assistant

## 2020-02-12 ENCOUNTER — Encounter: Payer: Self-pay | Admitting: Physician Assistant

## 2020-02-12 VITALS — BP 118/72 | HR 76 | Temp 97.5°F | Ht 72.0 in | Wt 176.0 lb

## 2020-02-12 DIAGNOSIS — J060 Acute laryngopharyngitis: Secondary | ICD-10-CM | POA: Insufficient documentation

## 2020-02-12 HISTORY — PX: CERVICAL DISC ARTHROPLASTY: SHX587

## 2020-02-12 MED ORDER — AZITHROMYCIN 250 MG PO TABS: ORAL_TABLET | ORAL | 0 refills | Status: DC

## 2020-02-12 MED ORDER — BREZTRI AEROSPHERE 160-9-4.8 MCG/ACT IN AERO: 2.0000 | INHALATION_SPRAY | Freq: Two times a day (BID) | RESPIRATORY_TRACT | 0 refills | Status: DC

## 2020-02-12 NOTE — Progress Notes (Signed)
Acute Office Visit  Subjective:    Patient ID: Robert Mcneil, male    DOB: 07/29/1971, 49 y.o.   MRN: [id number]  Chief Complaint  Patient presents with   Sinusitis    HPI Patient is in today for URI and cough Pt was seen at Urgent Care 2 weeks ago - had cough, congestion, sinus drainage and was treated with Augmentin and prednisone States he is still having congestion and productive cough with occasional wheezing  Past Medical History:  Diagnosis Date   Anxiety    Depression     Past Surgical History:  Procedure Laterality Date   none      Family History  Problem Relation Age of Onset   Breast cancer Paternal Grandmother     Social History   Socioeconomic History   Marital status: Married    Spouse name: Not on file   Number of children: 2   Years of education: Not on file   Highest education level: Not on file  Occupational History   Occupation: Cabin crew  Tobacco Use   Smoking status: Never Smoker   Smokeless tobacco: Never Used  Scientific laboratory technician Use: Never used  Substance and Sexual Activity   Alcohol use: Never   Drug use: Never   Sexual activity: Not on file  Other Topics Concern   Not on file  Social History Narrative   Not on file   Social Determinants of Health   Financial Resource Strain:    Difficulty of Paying Living Expenses:   Food Insecurity:    Worried About Charity fundraiser in the Last Year:    Arboriculturist in the Last Year:   Transportation Needs:    Film/video editor (Medical):    Lack of Transportation (Non-Medical):   Physical Activity:    Days of Exercise per Week:    Minutes of Exercise per Session:   Stress:    Feeling of Stress :   Social Connections:    Frequency of Communication with Friends and Family:    Frequency of Social Gatherings with Friends and Family:    Attends Religious Services:    Active Member of Clubs or Organizations:    Attends Programme researcher, broadcasting/film/video:    Marital Status:   Intimate Partner Violence:    Fear of Current or Ex-Partner:    Emotionally Abused:    Physically Abused:    Sexually Abused:      Current Outpatient Medications:    citalopram (CELEXA) 40 MG tablet, Take 1 tablet (40 mg total) by mouth daily., Disp: 90 tablet, Rfl: 1   ibuprofen (ADVIL) 800 MG tablet, Take 1 tablet (800 mg total) by mouth every 8 (eight) hours as needed., Disp: 90 tablet, Rfl: 1   LORazepam (ATIVAN) 0.5 MG tablet, Take 1 tablet (0.5 mg total) by mouth daily as needed., Disp: 30 tablet, Rfl: 2   meclizine (ANTIVERT) 25 MG tablet, Take 1 tablet (25 mg total) by mouth 3 (three) times daily as needed., Disp: 90 tablet, Rfl: 1   sildenafil (VIAGRA) 50 MG tablet, SMARTSIG:1 Tablet(s) By Mouth As Needed, Disp: 30 tablet, Rfl: 1   SUMAtriptan (IMITREX) 100 MG tablet, Take 1 tablet (100 mg total) by mouth 2 (two) times daily as needed., Disp: 30 tablet, Rfl: 1   azithromycin (ZITHROMAX) 250 MG tablet, 2 po day one then 1 po days 2-5, Disp: 6 each, Rfl: 0   Budeson-Glycopyrrol-Formoterol (BREZTRI AEROSPHERE) 160-9-4.8 MCG/ACT AERO,  Inhale 2 puffs into the lungs in the morning and at bedtime., Disp: 8 g, Rfl: 0   No Known Allergies  CONSTITUTIONAL: Negative for chills, fatigue, fever, unintentional weight gain and unintentional weight loss.  E/N/T: see HPI CARDIOVASCULAR: Negative for chest pain, dizziness, palpitations and pedal edema.  RESPIRATORY: see HPI  PSYCHIATRIC: Negative for sleep disturbance and to question depression screen.  Negative for depression, negative for anhedonia.         Objective:    PHYSICAL EXAM:   VS: BP 118/72 (BP Location: Left Arm, Patient Position: Sitting)    Pulse 76    Temp (!) 97.5 F (36.4 C) (Temporal)    Ht 6' (1.829 m)    Wt 176 lb (79.8 kg)    SpO2 96%    BMI 23.87 kg/m   GEN: Well nourished, well developed, in no acute distress  HEENT: normal external ears and nose -  -  Lips, Teeth and Gums - normal  Oropharynx - normal mucosa, palate, and posterior pharynx  Cardiac: RRR; no murmurs, rubs, or gallops,no edema - no significant varicosities Respiratory:  normal respiratory rate and pattern with no distress -mild scattered expiratory wheezes noted  Psych: euthymic mood, appropriate affect and demeanor   Wt Readings from Last 3 Encounters:  02/12/20 176 lb (79.8 kg)  01/26/20 175 lb (79.4 kg)  10/22/19 176 lb (79.8 kg)    Health Maintenance Due  Topic Date Due   COVID-19 Vaccine (1) Never done    There are no preventive care reminders to display for this patient.        Assessment & Plan:   Problem List Items Addressed This Visit      Respiratory   Acute laryngopharyngitis - Primary    otc decongestants as needed rx for zpack Sample of breztri 2 puffs bid          Meds ordered this encounter  Medications   Budeson-Glycopyrrol-Formoterol (BREZTRI AEROSPHERE) 160-9-4.8 MCG/ACT AERO    Sig: Inhale 2 puffs into the lungs in the morning and at bedtime.    Dispense:  8 g    Refill:  0    Order Specific Question:   Supervising Provider    Answer:   Shelton Silvas   azithromycin (ZITHROMAX) 250 MG tablet    Sig: 2 po day one then 1 po days 2-5    Dispense:  6 each    Refill:  0    Order Specific Question:   Supervising Provider    Answer:   COX, Elnita Maxwell [[id number]]     Glenn Dale, PA-C

## 2020-02-12 NOTE — Assessment & Plan Note (Signed)
otc decongestants as needed rx for zpack Sample of breztri 2 puffs bid

## 2020-03-08 ENCOUNTER — Ambulatory Visit: Payer: 59 | Admitting: Physician Assistant

## 2020-03-29 ENCOUNTER — Other Ambulatory Visit: Payer: Self-pay

## 2020-03-29 MED ORDER — BREZTRI AEROSPHERE 160-9-4.8 MCG/ACT IN AERO: 2.0000 | INHALATION_SPRAY | Freq: Two times a day (BID) | RESPIRATORY_TRACT | 0 refills | Status: DC

## 2020-04-21 ENCOUNTER — Encounter: Payer: Self-pay | Admitting: Physician Assistant

## 2020-04-21 ENCOUNTER — Other Ambulatory Visit: Payer: Self-pay

## 2020-04-21 ENCOUNTER — Ambulatory Visit (INDEPENDENT_AMBULATORY_CARE_PROVIDER_SITE_OTHER): Payer: 59 | Admitting: Physician Assistant

## 2020-04-21 ENCOUNTER — Other Ambulatory Visit: Payer: Self-pay | Admitting: Physician Assistant

## 2020-04-21 VITALS — BP 120/80 | HR 86 | Temp 98.7°F | Resp 17 | Ht 71.0 in | Wt 181.0 lb

## 2020-04-21 DIAGNOSIS — R1013 Epigastric pain: Secondary | ICD-10-CM

## 2020-04-21 MED ORDER — PANTOPRAZOLE SODIUM 40 MG PO TBEC: 40.0000 mg | DELAYED_RELEASE_TABLET | Freq: Every day | ORAL | 3 refills | Status: DC

## 2020-04-21 NOTE — Progress Notes (Signed)
Acute Office Visit  Subjective:    Patient ID: Robert Mcneil, male    DOB: 30-Dec-1970, 49 y.o.   MRN: [id number]  Chief Complaint  Patient presents with  . Abdominal Pain    Since 2 weeks ago,patient noticed aching pain on epigastric area after he eats.    HPI Patient is in today for epigastric pain - pt states for the past 2 weeks he has had pain and belching along with reflux type symptoms Denies nausea or vomiting - has used otc omeprazole with minimal relief Denies chest pain/sob  Past Medical History:  Diagnosis Date  . Anxiety   . Depression     Past Surgical History:  Procedure Laterality Date  . none      Family History  Problem Relation Age of Onset  . Breast cancer Paternal Grandmother   . Diabetes Mother   . Alzheimer's disease Father     Social History   Socioeconomic History  . Marital status: Married    Spouse name: Not on file  . Number of children: 2  . Years of education: Not on file  . Highest education level: Not on file  Occupational History  . Occupation: Cabin crew  Tobacco Use  . Smoking status: Current Every Day Smoker    Packs/day: 1.00    Types: Cigarettes  . Smokeless tobacco: Never Used  Vaping Use  . Vaping Use: Never used  Substance and Sexual Activity  . Alcohol use: Not Currently  . Drug use: Never  . Sexual activity: Yes    Partners: Female  Other Topics Concern  . Not on file  Social History Narrative  . Not on file   Social Determinants of Health   Financial Resource Strain:   . Difficulty of Paying Living Expenses: Not on file  Food Insecurity:   . Worried About Charity fundraiser in the Last Year: Not on file  . Ran Out of Food in the Last Year: Not on file  Transportation Needs:   . Lack of Transportation (Medical): Not on file  . Lack of Transportation (Non-Medical): Not on file  Physical Activity:   . Days of Exercise per Week: Not on file  . Minutes of Exercise per Session: Not on file    Stress:   . Feeling of Stress : Not on file  Social Connections:   . Frequency of Communication with Friends and Family: Not on file  . Frequency of Social Gatherings with Friends and Family: Not on file  . Attends Religious Services: Not on file  . Active Member of Clubs or Organizations: Not on file  . Attends Archivist Meetings: Not on file  . Marital Status: Not on file  Intimate Partner Violence:   . Fear of Current or Ex-Partner: Not on file  . Emotionally Abused: Not on file  . Physically Abused: Not on file  . Sexually Abused: Not on file     Current Outpatient Medications:  .  Budeson-Glycopyrrol-Formoterol (BREZTRI AEROSPHERE) 160-9-4.8 MCG/ACT AERO, Inhale 2 puffs into the lungs in the morning and at bedtime., Disp: 8 g, Rfl: 0 .  ibuprofen (ADVIL) 800 MG tablet, Take 1 tablet (800 mg total) by mouth every 8 (eight) hours as needed., Disp: 90 tablet, Rfl: 1 .  meclizine (ANTIVERT) 25 MG tablet, Take 1 tablet (25 mg total) by mouth 3 (three) times daily as needed., Disp: 90 tablet, Rfl: 1 .  sildenafil (VIAGRA) 50 MG tablet, SMARTSIG:1 Tablet(s) By Mouth As  Needed, Disp: 30 tablet, Rfl: 1 .  SUMAtriptan (IMITREX) 100 MG tablet, Take 1 tablet (100 mg total) by mouth 2 (two) times daily as needed., Disp: 30 tablet, Rfl: 1 .  pantoprazole (PROTONIX) 40 MG tablet, Take 1 tablet (40 mg total) by mouth daily., Disp: 30 tablet, Rfl: 3   No Known Allergies  CONSTITUTIONAL: Negative for chills, fatigue, fever, unintentional weight gain and unintentional weight loss.  CARDIOVASCULAR: Negative for chest pain, dizziness, palpitations and pedal edema.  RESPIRATORY: Negative for recent cough and dyspnea.  GASTROINTESTINAL: see HPI PSYCHIATRIC: Negative for sleep disturbance and to question depression screen.  Negative for depression, negative for anhedonia.         Objective:    PHYSICAL EXAM:   VS: BP 120/80 (BP Location: Right Arm, Patient Position: Sitting)   Pulse  86   Temp 98.7 F (37.1 C)   Resp 17   Ht 5\' 11"  (1.803 m)   Wt 181 lb (82.1 kg)   SpO2 97%   BMI 25.24 kg/m   GEN: Well nourished, well developed, in no acute distress  Cardiac: RRR; no murmurs, rubs, or gallops,no edema -  Respiratory:  normal respiratory rate and pattern with no distress - normal breath sounds with no rales, rhonchi, wheezes or rubs GI: normal bowel sounds, mild tenderness to palpation Skin: warm and dry, no rash  Neuro:  Alert and Oriented x 3, Strength and sensation are intact - CN II-Xii grossly intact Psych: euthymic mood, appropriate affect and demeanor  EKG normal Wt Readings from Last 3 Encounters:  04/21/20 181 lb (82.1 kg)  02/12/20 176 lb (79.8 kg)  01/26/20 175 lb (79.4 kg)    Health Maintenance Due  Topic Date Due  . COVID-19 Vaccine (1) Never done  . INFLUENZA VACCINE  03/14/2020    There are no preventive care reminders to display for this patient.        Assessment & Plan:   Problem List Items Addressed This Visit      Other   Epigastric pain - Primary    rx for protonix labwork pending Follow up if symptoms persist/worsen      Relevant Orders   CBC with Differential/Platelet   Comprehensive metabolic panel   Helicobacter pylori abs-IgG+IgA, bld   EKG 12-Lead       Meds ordered this encounter  Medications  . pantoprazole (PROTONIX) 40 MG tablet    Sig: Take 1 tablet (40 mg total) by mouth daily.    Dispense:  30 tablet    Refill:  3    Order Specific Question:   Supervising Provider    Answer:   COX, Elnita Maxwell [[id number]]     [place], PA-C

## 2020-04-21 NOTE — Assessment & Plan Note (Signed)
rx for protonix labwork pending Follow up if symptoms persist/worsen

## 2020-04-22 ENCOUNTER — Other Ambulatory Visit: Payer: Self-pay | Admitting: Physician Assistant

## 2020-04-22 LAB — CBC WITH DIFFERENTIAL/PLATELET
Basophils Absolute: 0.1 10*3/uL (ref 0.0–0.2)
Basos: 1 %
EOS (ABSOLUTE): 0.3 10*3/uL (ref 0.0–0.4)
Eos: 5 %
Hematocrit: 41.6 % (ref 37.5–51.0)
Hemoglobin: 14.3 g/dL (ref 13.0–17.7)
Immature Grans (Abs): 0 10*3/uL (ref 0.0–0.1)
Immature Granulocytes: 0 %
Lymphocytes Absolute: 2.2 10*3/uL (ref 0.7–3.1)
Lymphs: 33 %
MCH: 32.1 pg (ref 26.6–33.0)
MCHC: 34.4 g/dL (ref 31.5–35.7)
MCV: 93 fL (ref 79–97)
Monocytes Absolute: 0.9 10*3/uL (ref 0.1–0.9)
Monocytes: 14 %
Neutrophils Absolute: 3.3 10*3/uL (ref 1.4–7.0)
Neutrophils: 47 %
Platelets: 260 10*3/uL (ref 150–450)
RBC: 4.46 x10E6/uL (ref 4.14–5.80)
RDW: 13.1 % (ref 11.6–15.4)
WBC: 6.8 10*3/uL (ref 3.4–10.8)

## 2020-04-22 LAB — COMPREHENSIVE METABOLIC PANEL
ALT: 33 IU/L (ref 0–44)
AST: 17 IU/L (ref 0–40)
Albumin/Globulin Ratio: 2 (ref 1.2–2.2)
Albumin: 4.2 g/dL (ref 4.0–5.0)
Alkaline Phosphatase: 77 IU/L (ref 48–121)
BUN/Creatinine Ratio: 19 (ref 9–20)
BUN: 15 mg/dL (ref 6–24)
Bilirubin Total: <0.2 mg/dL (ref 0.0–1.2)
CO2: 21 mmol/L (ref 20–29)
Calcium: 9 mg/dL (ref 8.7–10.2)
Chloride: 108 mmol/L — ABNORMAL HIGH (ref 96–106)
Creatinine, Ser: 0.77 mg/dL (ref 0.76–1.27)
GFR calc Af Amer: 122 mL/min/{1.73_m2} (ref >59)
GFR calc non Af Amer: 106 mL/min/{1.73_m2} (ref >59)
Globulin, Total: 2.1 g/dL (ref 1.5–4.5)
Glucose: 85 mg/dL (ref 65–99)
Potassium: 4.1 mmol/L (ref 3.5–5.2)
Sodium: 144 mmol/L (ref 134–144)
Total Protein: 6.3 g/dL (ref 6.0–8.5)

## 2020-04-22 LAB — HELICOBACTER PYLORI ABS-IGG+IGA, BLD
H. pylori, IgA Abs: <9 units (ref 0.0–8.9)
H. pylori, IgG AbS: 0.18 Index Value (ref 0.00–0.79)

## 2020-04-22 MED ORDER — ESOMEPRAZOLE MAGNESIUM 40 MG PO CPDR: 40.0000 mg | DELAYED_RELEASE_CAPSULE | Freq: Every day | ORAL | 3 refills | Status: DC

## 2020-05-11 ENCOUNTER — Other Ambulatory Visit: Payer: Self-pay | Admitting: Physician Assistant

## 2020-05-11 MED ORDER — ALBUTEROL SULFATE HFA 108 (90 BASE) MCG/ACT IN AERS: 2.0000 | INHALATION_SPRAY | Freq: Four times a day (QID) | RESPIRATORY_TRACT | 0 refills | Status: DC | PRN

## 2020-05-31 ENCOUNTER — Other Ambulatory Visit: Payer: Self-pay | Admitting: Physician Assistant

## 2020-06-17 ENCOUNTER — Other Ambulatory Visit: Payer: Self-pay | Admitting: Physician Assistant

## 2020-07-15 ENCOUNTER — Ambulatory Visit (INDEPENDENT_AMBULATORY_CARE_PROVIDER_SITE_OTHER): Payer: 59 | Admitting: Physician Assistant

## 2020-07-15 ENCOUNTER — Encounter: Payer: Self-pay | Admitting: Physician Assistant

## 2020-07-15 ENCOUNTER — Other Ambulatory Visit: Payer: Self-pay

## 2020-07-15 VITALS — BP 112/74 | HR 79 | Temp 98.2°F | Ht 71.0 in | Wt 181.6 lb

## 2020-07-15 DIAGNOSIS — L989 Disorder of the skin and subcutaneous tissue, unspecified: Secondary | ICD-10-CM | POA: Diagnosis not present

## 2020-07-15 MED ORDER — CEPHALEXIN 500 MG PO CAPS: 500.0000 mg | ORAL_CAPSULE | Freq: Four times a day (QID) | ORAL | 0 refills | Status: DC

## 2020-07-15 NOTE — Progress Notes (Signed)
Acute Office Visit  Subjective:    Patient ID: Robert Mcneil, male    DOB: 03-04-1971, 49 y.o.   MRN: [id number]  Chief Complaint  Patient presents with  . Facial Swelling    Left    HPI Patient is in today for skin lesion below left eye - says it just came up yesterday - he has several small cysts below eye and a few on his nose that he would like to see dermatology about -- states the lesion below eye has been red and inflamed  Past Medical History:  Diagnosis Date  . Anxiety   . Depression     Past Surgical History:  Procedure Laterality Date  . none      Family History  Problem Relation Age of Onset  . Breast cancer Paternal Grandmother   . Diabetes Mother   . Alzheimer's disease Father     Social History   Socioeconomic History  . Marital status: Married    Spouse name: Not on file  . Number of children: 2  . Years of education: Not on file  . Highest education level: Not on file  Occupational History  . Occupation: Cabin crew  Tobacco Use  . Smoking status: Current Every Day Smoker    Packs/day: 1.00    Types: Cigarettes  . Smokeless tobacco: Never Used  Vaping Use  . Vaping Use: Never used  Substance and Sexual Activity  . Alcohol use: Not Currently  . Drug use: Never  . Sexual activity: Yes    Partners: Female  Other Topics Concern  . Not on file  Social History Narrative  . Not on file   Social Determinants of Health   Financial Resource Strain:   . Difficulty of Paying Living Expenses: Not on file  Food Insecurity:   . Worried About Charity fundraiser in the Last Year: Not on file  . Ran Out of Food in the Last Year: Not on file  Transportation Needs:   . Lack of Transportation (Medical): Not on file  . Lack of Transportation (Non-Medical): Not on file  Physical Activity:   . Days of Exercise per Week: Not on file  . Minutes of Exercise per Session: Not on file  Stress:   . Feeling of Stress : Not on file  Social  Connections:   . Frequency of Communication with Friends and Family: Not on file  . Frequency of Social Gatherings with Friends and Family: Not on file  . Attends Religious Services: Not on file  . Active Member of Clubs or Organizations: Not on file  . Attends Archivist Meetings: Not on file  . Marital Status: Not on file  Intimate Partner Violence:   . Fear of Current or Ex-Partner: Not on file  . Emotionally Abused: Not on file  . Physically Abused: Not on file  . Sexually Abused: Not on file    Outpatient Medications Prior to Visit  Medication Sig Dispense Refill  . albuterol (VENTOLIN HFA) 108 (90 Base) MCG/ACT inhaler TAKE 2 PUFFS BY MOUTH EVERY 6 HOURS AS NEEDED FOR WHEEZE OR SHORTNESS OF BREATH 18 each 1  . Budeson-Glycopyrrol-Formoterol (BREZTRI AEROSPHERE) 160-9-4.8 MCG/ACT AERO Inhale 2 puffs into the lungs in the morning and at bedtime. 8 g 0  . esomeprazole (NEXIUM) 40 MG capsule Take 1 capsule (40 mg total) by mouth daily. 30 capsule 3  . ibuprofen (ADVIL) 800 MG tablet Take 1 tablet (800 mg total) by mouth every 8 (  eight) hours as needed. 90 tablet 1  . meclizine (ANTIVERT) 25 MG tablet Take 1 tablet (25 mg total) by mouth 3 (three) times daily as needed. 90 tablet 1  . sildenafil (VIAGRA) 50 MG tablet SMARTSIG:1 Tablet(s) By Mouth As Needed 30 tablet 1  . SUMAtriptan (IMITREX) 100 MG tablet TAKE ONE TABLET BY MOUTH AT ONSET OF HEADACHE. MAY REPEAT IN 2 HOURS IF NEEDED 9 tablet 5   No facility-administered medications prior to visit.    No Known Allergies  Review of Systems CONSTITUTIONAL: Negative for chills, fatigue, fever, unintentional weight gain and unintentional weight loss.  CARDIOVASCULAR: Negative for chest pain, dizziness, palpitations and pedal edema.  RESPIRATORY: Negative for recent cough and dyspnea.  INTEGUMENTARY: see HPI         Objective:    Physical Exam PHYSICAL EXAM:   VS: BP 112/74 (BP Location: Left Arm, Patient Position:  Sitting, Cuff Size: Normal)   Pulse 79   Temp 98.2 F (36.8 C) (Temporal)   Ht 5\' 11"  (1.803 m)   Wt 181 lb 9.6 oz (82.4 kg)   SpO2 99%   BMI 25.33 kg/m   GEN: Well nourished, well developed, in no acute distress  Cardiac: RRR; no murmurs, rubs, or gallops,no edema -  Respiratory:  normal respiratory rate and pattern with no distress - normal breath sounds with no rales, rhonchi, wheezes or rubs Skin: small cystic lesions noted on face and nose - one area with mild erythema and swelling   BP 112/74 (BP Location: Left Arm, Patient Position: Sitting, Cuff Size: Normal)   Pulse 79   Temp 98.2 F (36.8 C) (Temporal)   Ht 5\' 11"  (1.803 m)   Wt 181 lb 9.6 oz (82.4 kg)   SpO2 99%   BMI 25.33 kg/m  Wt Readings from Last 3 Encounters:  07/15/20 181 lb 9.6 oz (82.4 kg)  04/21/20 181 lb (82.1 kg)  02/12/20 176 lb (79.8 kg)    Health Maintenance Due  Topic Date Due  . COVID-19 Vaccine (1) Never done  . COLONOSCOPY  06/17/2020    There are no preventive care reminders to display for this patient.   No results found for: TSH Lab Results  Component Value Date   WBC 6.8 04/21/2020   HGB 14.3 04/21/2020   HCT 41.6 04/21/2020   MCV 93 04/21/2020   PLT 260 04/21/2020   Lab Results  Component Value Date   NA 144 04/21/2020   K 4.1 04/21/2020   CO2 21 04/21/2020   GLUCOSE 85 04/21/2020   BUN 15 04/21/2020   CREATININE 0.77 04/21/2020   BILITOT <0.2 04/21/2020   ALKPHOS 77 04/21/2020   AST 17 04/21/2020   ALT 33 04/21/2020   PROT 6.3 04/21/2020   ALBUMIN 4.2 04/21/2020   CALCIUM 9.0 04/21/2020   Lab Results  Component Value Date   CHOL 156 02/05/2020   Lab Results  Component Value Date   HDL 40 02/05/2020   Lab Results  Component Value Date   LDLCALC 97 02/05/2020   Lab Results  Component Value Date   TRIG 101 02/05/2020   Lab Results  Component Value Date   CHOLHDL 3.9 02/05/2020   No results found for: HGBA1C     Assessment & Plan:  1. Skin  lesion  rx for keflex as directed Refer to dermatology for removal  Meds ordered this encounter  Medications  . cephALEXin (KEFLEX) 500 MG capsule    Sig: Take 1 capsule (500 mg total)  by mouth 4 (four) times daily.    Dispense:  20 capsule    Refill:  0    Order Specific Question:   Supervising Provider    AnswerShelton Silvas    Orders Placed This Encounter  Procedures  . Ambulatory referral to Dermatology      Follow-up: Return if symptoms worsen or fail to improve.  An After Visit Summary was printed and given to the patient.  Yetta Flock [facility] [phone]

## 2020-07-25 ENCOUNTER — Other Ambulatory Visit: Payer: Self-pay | Admitting: Physician Assistant

## 2020-07-27 ENCOUNTER — Ambulatory Visit: Payer: 59 | Admitting: Physician Assistant

## 2020-08-11 ENCOUNTER — Other Ambulatory Visit: Payer: Self-pay

## 2020-08-11 ENCOUNTER — Other Ambulatory Visit: Payer: Self-pay | Admitting: Family Medicine

## 2020-08-11 ENCOUNTER — Encounter: Payer: Self-pay | Admitting: Physician Assistant

## 2020-08-11 ENCOUNTER — Ambulatory Visit (INDEPENDENT_AMBULATORY_CARE_PROVIDER_SITE_OTHER): Payer: 59 | Admitting: Physician Assistant

## 2020-08-11 VITALS — BP 122/78 | HR 86 | Temp 97.7°F | Ht 71.0 in | Wt 185.0 lb

## 2020-08-11 DIAGNOSIS — Z1211 Encounter for screening for malignant neoplasm of colon: Secondary | ICD-10-CM | POA: Diagnosis not present

## 2020-08-11 DIAGNOSIS — E782 Mixed hyperlipidemia: Secondary | ICD-10-CM | POA: Diagnosis not present

## 2020-08-11 DIAGNOSIS — F419 Anxiety disorder, unspecified: Secondary | ICD-10-CM

## 2020-08-11 DIAGNOSIS — Z8669 Personal history of other diseases of the nervous system and sense organs: Secondary | ICD-10-CM | POA: Diagnosis not present

## 2020-08-11 NOTE — Progress Notes (Signed)
Established Patient Office Visit  Subjective:  Patient ID: Robert Mcneil, male    DOB: 1971/01/23  Age: 49 y.o. MRN: [id number]  CC:  Chief Complaint  Patient presents with  . Hyperlipidemia    Follow up    HPI Robert Mcneil presents for follow up migraines  Pt has a history of migraines - does no have that often but when he does he uses imitrex 100mg  which relieves symptoms  Pt with history of hyperlipidemia - he is not on medications but does watch diet - is due for labwork today  Pt with history of anxiety -  - he is currently on celexa 40mg  qd, lorazepam 0.5mg  prn   Pt is due for screening colonoscopy - will schedule  Past Medical History:  Diagnosis Date  . Anxiety   . Depression     Past Surgical History:  Procedure Laterality Date  . none      Family History  Problem Relation Age of Onset  . Breast cancer Paternal Grandmother   . Diabetes Mother   . Alzheimer's disease Father     Social History   Socioeconomic History  . Marital status: Married    Spouse name: Not on file  . Number of children: 2  . Years of education: Not on file  . Highest education level: Not on file  Occupational History  . Occupation: Cabin crew  Tobacco Use  . Smoking status: Current Every Day Smoker    Packs/day: 1.00    Types: Cigarettes  . Smokeless tobacco: Never Used  Vaping Use  . Vaping Use: Never used  Substance and Sexual Activity  . Alcohol use: Not Currently  . Drug use: Never  . Sexual activity: Yes    Partners: Female  Other Topics Concern  . Not on file  Social History Narrative  . Not on file   Social Determinants of Health   Financial Resource Strain: Not on file  Food Insecurity: Not on file  Transportation Needs: Not on file  Physical Activity: Not on file  Stress: Not on file  Social Connections: Not on file  Intimate Partner Violence: Not on file     Current Outpatient Medications:  .  albuterol (VENTOLIN HFA) 108 (90 Base)  MCG/ACT inhaler, TAKE 2 PUFFS BY MOUTH EVERY 6 HOURS AS NEEDED FOR WHEEZE OR SHORTNESS OF BREATH, Disp: 18 each, Rfl: 1 .  Budeson-Glycopyrrol-Formoterol (BREZTRI AEROSPHERE) 160-9-4.8 MCG/ACT AERO, Inhale 2 puffs into the lungs in the morning and at bedtime., Disp: 8 g, Rfl: 0 .  citalopram (CELEXA) 40 MG tablet, TAKE 1 TABLET BY MOUTH EVERY DAY, Disp: 90 tablet, Rfl: 1 .  esomeprazole (NEXIUM) 40 MG capsule, Take 1 capsule (40 mg total) by mouth daily., Disp: 30 capsule, Rfl: 3 .  ibuprofen (ADVIL) 800 MG tablet, Take 1 tablet (800 mg total) by mouth every 8 (eight) hours as needed., Disp: 90 tablet, Rfl: 1 .  meclizine (ANTIVERT) 25 MG tablet, Take 1 tablet (25 mg total) by mouth 3 (three) times daily as needed., Disp: 90 tablet, Rfl: 1 .  sildenafil (VIAGRA) 50 MG tablet, SMARTSIG:1 Tablet(s) By Mouth As Needed, Disp: 30 tablet, Rfl: 1 .  SUMAtriptan (IMITREX) 100 MG tablet, TAKE ONE TABLET BY MOUTH AT ONSET OF HEADACHE. MAY REPEAT IN 2 HOURS IF NEEDED, Disp: 9 tablet, Rfl: 5   No Known Allergies  ROS CONSTITUTIONAL: Negative for chills, fatigue, fever, unintentional weight gain and unintentional weight loss.  E/N/T: Negative for ear pain, nasal congestion  and sore throat.  CARDIOVASCULAR: Negative for chest pain, dizziness, palpitations and pedal edema.  RESPIRATORY: Negative for recent cough and dyspnea.  PSYCHIATRIC: Negative for sleep disturbance and to question depression screen.  Negative for depression, negative for anhedonia.        Objective:    PHYSICAL EXAM:   VS: BP 122/78 (BP Location: Left Arm, Patient Position: Sitting, Cuff Size: Normal)   Pulse 86   Temp 97.7 F (36.5 C) (Temporal)   Ht 5\' 11"  (1.803 m)   Wt 185 lb (83.9 kg)   SpO2 95%   BMI 25.80 kg/m   GEN: Well nourished, well developed, in no acute distress   Cardiac: RRR; no murmurs, rubs, or gallops,no edema -  Respiratory:  normal respiratory rate and pattern with no distress - normal breath sounds  with no rales, rhonchi, wheezes or rubs Neuro:  Alert and Oriented x 3, Strength and sensation are intact - CN II-Xii grossly intact Psych: euthymic mood, appropriate affect and demeanor  BP 122/78 (BP Location: Left Arm, Patient Position: Sitting, Cuff Size: Normal)   Pulse 86   Temp 97.7 F (36.5 C) (Temporal)   Ht 5\' 11"  (1.803 m)   Wt 185 lb (83.9 kg)   SpO2 95%   BMI 25.80 kg/m  Wt Readings from Last 3 Encounters:  08/11/20 185 lb (83.9 kg)  07/15/20 181 lb 9.6 oz (82.4 kg)  04/21/20 181 lb (82.1 kg)     Health Maintenance Due  Topic Date Due  . COVID-19 Vaccine (1) Never done  . COLONOSCOPY (Pts 45-54yrs Insurance coverage will need to be confirmed)  06/17/2020    There are no preventive care reminders to display for this patient.  No results found for: TSH Lab Results  Component Value Date   WBC 6.8 04/21/2020   HGB 14.3 04/21/2020   HCT 41.6 04/21/2020   MCV 93 04/21/2020   PLT 260 04/21/2020   Lab Results  Component Value Date   NA 144 04/21/2020   K 4.1 04/21/2020   CO2 21 04/21/2020   GLUCOSE 85 04/21/2020   BUN 15 04/21/2020   CREATININE 0.77 04/21/2020   BILITOT <0.2 04/21/2020   ALKPHOS 77 04/21/2020   AST 17 04/21/2020   ALT 33 04/21/2020   PROT 6.3 04/21/2020   ALBUMIN 4.2 04/21/2020   CALCIUM 9.0 04/21/2020   Lab Results  Component Value Date   CHOL 156 02/05/2020   Lab Results  Component Value Date   HDL 40 02/05/2020   Lab Results  Component Value Date   LDLCALC 97 02/05/2020   Lab Results  Component Value Date   TRIG 101 02/05/2020   Lab Results  Component Value Date   CHOLHDL 3.9 02/05/2020   No results found for: HGBA1C    Assessment & Plan:   Problem List Items Addressed This Visit      Other   Mixed hyperlipidemia - Primary   Relevant Orders   CBC with Differential/Platelet   Comprehensive metabolic panel   Lipid panel Watch diet   Anxiety Continue meds as directed   Hx of migraines Continue meds as  directed   Screen for colon cancer   Relevant Orders   Ambulatory referral to Gastroenterology      No orders of the defined types were placed in this encounter.   Follow-up: Return in about 6 months (around 02/09/2021) for chronic fasting follow up.    SARA R DAVIS, PA-C

## 2020-08-12 LAB — CBC WITH DIFFERENTIAL/PLATELET
Basophils Absolute: 0.1 10*3/uL (ref 0.0–0.2)
Basos: 1 %
EOS (ABSOLUTE): 0.3 10*3/uL (ref 0.0–0.4)
Eos: 4 %
Hematocrit: 39 % (ref 37.5–51.0)
Hemoglobin: 13.4 g/dL (ref 13.0–17.7)
Immature Grans (Abs): 0 10*3/uL (ref 0.0–0.1)
Immature Granulocytes: 1 %
Lymphocytes Absolute: 1.5 10*3/uL (ref 0.7–3.1)
Lymphs: 21 %
MCH: 31.2 pg (ref 26.6–33.0)
MCHC: 34.4 g/dL (ref 31.5–35.7)
MCV: 91 fL (ref 79–97)
Monocytes Absolute: 1.4 10*3/uL — ABNORMAL HIGH (ref 0.1–0.9)
Monocytes: 21 %
Neutrophils Absolute: 3.7 10*3/uL (ref 1.4–7.0)
Neutrophils: 52 %
Platelets: 240 10*3/uL (ref 150–450)
RBC: 4.29 x10E6/uL (ref 4.14–5.80)
RDW: 12.4 % (ref 11.6–15.4)
WBC: 6.9 10*3/uL (ref 3.4–10.8)

## 2020-08-12 LAB — COMPREHENSIVE METABOLIC PANEL
ALT: 31 IU/L (ref 0–44)
AST: 25 IU/L (ref 0–40)
Albumin/Globulin Ratio: 2.6 — ABNORMAL HIGH (ref 1.2–2.2)
Albumin: 4.5 g/dL (ref 3.8–4.9)
Alkaline Phosphatase: 75 IU/L (ref 44–121)
BUN/Creatinine Ratio: 16 (ref 9–20)
BUN: 14 mg/dL (ref 6–24)
Bilirubin Total: 0.2 mg/dL (ref 0.0–1.2)
CO2: 22 mmol/L (ref 20–29)
Calcium: 8.9 mg/dL (ref 8.7–10.2)
Chloride: 105 mmol/L (ref 96–106)
Creatinine, Ser: 0.89 mg/dL (ref 0.76–1.27)
GFR calc Af Amer: 114 mL/min/{1.73_m2} (ref >59)
GFR calc non Af Amer: 99 mL/min/{1.73_m2} (ref >59)
Globulin, Total: 1.7 g/dL (ref 1.5–4.5)
Glucose: 105 mg/dL — ABNORMAL HIGH (ref 65–99)
Potassium: 4.1 mmol/L (ref 3.5–5.2)
Sodium: 140 mmol/L (ref 134–144)
Total Protein: 6.2 g/dL (ref 6.0–8.5)

## 2020-08-12 LAB — LIPID PANEL
Chol/HDL Ratio: 4.4 ratio (ref 0.0–5.0)
Cholesterol, Total: 167 mg/dL (ref 100–199)
HDL: 38 mg/dL — ABNORMAL LOW (ref >39)
LDL Chol Calc (NIH): 115 mg/dL — ABNORMAL HIGH (ref 0–99)
Triglycerides: 71 mg/dL (ref 0–149)
VLDL Cholesterol Cal: 14 mg/dL (ref 5–40)

## 2020-08-12 LAB — CARDIOVASCULAR RISK ASSESSMENT

## 2020-08-17 ENCOUNTER — Telehealth (INDEPENDENT_AMBULATORY_CARE_PROVIDER_SITE_OTHER): Payer: 59 | Admitting: Nurse Practitioner

## 2020-08-17 ENCOUNTER — Encounter: Payer: Self-pay | Admitting: Nurse Practitioner

## 2020-08-17 VITALS — Ht 71.0 in | Wt 185.0 lb

## 2020-08-17 DIAGNOSIS — R0981 Nasal congestion: Secondary | ICD-10-CM

## 2020-08-17 DIAGNOSIS — Z20822 Contact with and (suspected) exposure to covid-19: Secondary | ICD-10-CM | POA: Diagnosis not present

## 2020-08-17 NOTE — Progress Notes (Signed)
Virtual Visit via Telephone Note   This visit type was conducted due to national recommendations for restrictions regarding the COVID-19 Pandemic (e.g. social distancing) in an effort to limit this patient's exposure and mitigate transmission in our community.  Due to his co-morbid illnesses, this patient is at least at moderate risk for complications without adequate follow up.  This format is felt to be most appropriate for this patient at this time.  The patient did not have access to video technology/had technical difficulties with video requiring transitioning to audio format only (telephone).  All issues noted in this document were discussed and addressed.  No physical exam could be performed with this format.  Patient verbally consented to a telehealth visit.   Date:  08/17/2020   ID:  Robert Mcneil, DOB Sep 04, 1970, MRN [id number]  Patient Location: Home Provider Location: Office/Clinic  PCP:  Marge Duncans, PA-C   Evaluation Performed:  Established patient, acute telemedicine visit  Chief Complaint:  Cough  History of Present Illness:    Robert Mcneil is a 50 y.o. male with sinus congestion, headache, and cough.[name] states he has a congested non-productive cough that worsens when he lies down. Onset of symptoms was 3 days ago. He has been in close contact with his spouse and father that have recently tested positive for COVID-19. Treatment has included Robitussin and Ibuprofen. He is a 20 year/ppd smoker. He has not received COVID-19 or flu immunizations this year.    The patient does have symptoms concerning for COVID-19 infection (fever, chills, cough, or new shortness of breath).    Past Medical History:  Diagnosis Date  . Anxiety   . Depression     Past Surgical History:  Procedure Laterality Date  . none      Family History  Problem Relation Age of Onset  . Breast cancer Paternal Grandmother   . Diabetes Mother   . Alzheimer's disease Father     Social  History   Socioeconomic History  . Marital status: Married    Spouse name: Not on file  . Number of children: 2  . Years of education: Not on file  . Highest education level: Not on file  Occupational History  . Occupation: Cabin crew  Tobacco Use  . Smoking status: Current Every Day Smoker    Packs/day: 1.00    Types: Cigarettes  . Smokeless tobacco: Never Used  Vaping Use  . Vaping Use: Never used  Substance and Sexual Activity  . Alcohol use: Not Currently  . Drug use: Never  . Sexual activity: Yes    Partners: Female  Other Topics Concern  . Not on file  Social History Narrative  . Not on file   Social Determinants of Health   Financial Resource Strain: Not on file  Food Insecurity: Not on file  Transportation Needs: Not on file  Physical Activity: Not on file  Stress: Not on file  Social Connections: Not on file  Intimate Partner Violence: Not on file    Outpatient Medications Prior to Visit  Medication Sig Dispense Refill  . albuterol (VENTOLIN HFA) 108 (90 Base) MCG/ACT inhaler TAKE 2 PUFFS BY MOUTH EVERY 6 HOURS AS NEEDED FOR WHEEZE OR SHORTNESS OF BREATH 18 each 1  . Budeson-Glycopyrrol-Formoterol (BREZTRI AEROSPHERE) 160-9-4.8 MCG/ACT AERO Inhale 2 puffs into the lungs in the morning and at bedtime. 8 g 0  . citalopram (CELEXA) 40 MG tablet TAKE 1 TABLET BY MOUTH EVERY DAY 90 tablet 1  . esomeprazole (NEXIUM) 40  MG capsule Take 1 capsule (40 mg total) by mouth daily. 30 capsule 3  . ibuprofen (ADVIL) 800 MG tablet Take 1 tablet (800 mg total) by mouth every 8 (eight) hours as needed. 90 tablet 1  . meclizine (ANTIVERT) 25 MG tablet Take 1 tablet (25 mg total) by mouth 3 (three) times daily as needed. 90 tablet 1  . sildenafil (VIAGRA) 50 MG tablet SMARTSIG:1 Tablet(s) By Mouth As Needed 30 tablet 1  . SUMAtriptan (IMITREX) 100 MG tablet TAKE ONE TABLET BY MOUTH AT ONSET OF HEADACHE. MAY REPEAT IN 2 HOURS IF NEEDED 9 tablet 5   No facility-administered  medications prior to visit.    Allergies:   Patient has no known allergies.   Social History   Tobacco Use  . Smoking status: Current Every Day Smoker    Packs/day: 1.00    Types: Cigarettes  . Smokeless tobacco: Never Used  Vaping Use  . Vaping Use: Never used  Substance Use Topics  . Alcohol use: Not Currently  . Drug use: Never     Review of Systems  Constitutional: Negative for fever and malaise/fatigue.  HENT: Positive for congestion. Negative for ear pain, sinus pain and sore throat.   Respiratory: Positive for cough. Negative for sputum production, shortness of breath and wheezing.   Cardiovascular: Negative for chest pain.  Gastrointestinal: Negative for abdominal pain, constipation, diarrhea, nausea and vomiting.  Genitourinary: Negative for frequency and urgency.  Musculoskeletal: Negative for joint pain and myalgias.  Neurological: Positive for headaches. Negative for dizziness.     Labs/Other Tests and Data Reviewed:    Recent Labs: 08/11/2020: ALT 31; BUN 14; Creatinine, Ser 0.89; Hemoglobin 13.4; Platelets 240; Potassium 4.1; Sodium 140   Recent Lipid Panel Lab Results  Component Value Date/Time   CHOL 167 08/11/2020 03:58 PM   TRIG 71 08/11/2020 03:58 PM   HDL 38 (L) 08/11/2020 03:58 PM   CHOLHDL 4.4 08/11/2020 03:58 PM   LDLCALC 115 (H) 08/11/2020 03:58 PM    Wt Readings from Last 3 Encounters:  08/17/20 185 lb (83.9 kg)  08/11/20 185 lb (83.9 kg)  07/15/20 181 lb 9.6 oz (82.4 kg)     Objective:    Vital Signs:  Ht 5\' 11"  (1.803 m)   Wt 185 lb (83.9 kg)   BMI 25.80 kg/m    Physical Exam Vitals reviewed.    No physical exam performed due to telemedicine visit  ASSESSMENT & PLAN:     1. Sinus congestion - POC COVID-19 - Novel Coronavirus, NAA ([facility])  2. Close exposure to COVID-19 virus - POC COVID-19 - Novel Coronavirus, NAA ([facility])  Rapid COVID-19 tests negative today. Pt instructed to quarantine pending COVID-19 pcr  results.    COVID-19 Education: The signs and symptoms of COVID-19 were discussed with the patient and how to seek care for testing (follow up with PCP or arrange E-visit). The importance of social distancing was discussed today.   I spent 10 minutes dedicated to the care of this patient on the date of this encounter to include face-to-face time with the patient, as well as: EMR review  Follow Up:  Virtual Visit  prn  Signed,  , DNP  08/17/2020 21:56    [facility] [place]

## 2020-08-20 LAB — NOVEL CORONAVIRUS, NAA: SARS-CoV-2, NAA: NOT DETECTED

## 2020-08-23 ENCOUNTER — Other Ambulatory Visit: Payer: Self-pay | Admitting: Physician Assistant

## 2020-09-02 ENCOUNTER — Encounter: Payer: Self-pay | Admitting: Physician Assistant

## 2020-09-23 ENCOUNTER — Other Ambulatory Visit: Payer: Self-pay

## 2020-09-23 MED ORDER — ALBUTEROL SULFATE HFA 108 (90 BASE) MCG/ACT IN AERS: 2.0000 | INHALATION_SPRAY | RESPIRATORY_TRACT | 0 refills | Status: DC | PRN

## 2020-10-07 LAB — HM COLONOSCOPY

## 2020-10-13 ENCOUNTER — Encounter: Payer: Self-pay | Admitting: Physician Assistant

## 2020-12-27 ENCOUNTER — Other Ambulatory Visit: Payer: Self-pay | Admitting: Physician Assistant

## 2021-02-10 ENCOUNTER — Encounter: Payer: Self-pay | Admitting: Physician Assistant

## 2021-02-10 ENCOUNTER — Ambulatory Visit (INDEPENDENT_AMBULATORY_CARE_PROVIDER_SITE_OTHER): Payer: 59 | Admitting: Physician Assistant

## 2021-02-10 ENCOUNTER — Other Ambulatory Visit: Payer: Self-pay

## 2021-02-10 VITALS — BP 110/72 | HR 77 | Temp 97.6°F | Ht 71.0 in | Wt 179.0 lb

## 2021-02-10 DIAGNOSIS — E782 Mixed hyperlipidemia: Secondary | ICD-10-CM

## 2021-02-10 DIAGNOSIS — R1013 Epigastric pain: Secondary | ICD-10-CM | POA: Diagnosis not present

## 2021-02-10 MED ORDER — ALBUTEROL SULFATE HFA 108 (90 BASE) MCG/ACT IN AERS: 2.0000 | INHALATION_SPRAY | RESPIRATORY_TRACT | 5 refills | Status: DC | PRN

## 2021-02-10 MED ORDER — SILDENAFIL CITRATE 50 MG PO TABS: ORAL_TABLET | ORAL | 5 refills | Status: DC

## 2021-02-10 MED ORDER — IBUPROFEN 800 MG PO TABS: 800.0000 mg | ORAL_TABLET | Freq: Three times a day (TID) | ORAL | 1 refills | Status: DC | PRN

## 2021-02-10 MED ORDER — BREZTRI AEROSPHERE 160-9-4.8 MCG/ACT IN AERO: 2.0000 | INHALATION_SPRAY | Freq: Two times a day (BID) | RESPIRATORY_TRACT | 5 refills | Status: DC

## 2021-02-10 NOTE — Progress Notes (Signed)
Subjective:  Patient ID: Robert Mcneil, male    DOB: 1971-08-01  Age: 50 y.o. MRN: [id number]  Chief Complaint  Patient presents with   Hyperlipidemia    HPI  Pt with history of hyperlipidemia - has been controlling with diet and currently not on medication for this issue - voices no concerns  Pt with history of GERD - takes otc prilosec which controls symptoms  Pt requests refill of viagra Current Outpatient Medications on File Prior to Visit  Medication Sig Dispense Refill   albuterol (VENTOLIN HFA) 108 (90 Base) MCG/ACT inhaler Inhale 2 puffs into the lungs every 4 (four) hours as needed for wheezing or shortness of breath. 18 each 0   Budeson-Glycopyrrol-Formoterol (BREZTRI AEROSPHERE) 160-9-4.8 MCG/ACT AERO Inhale 2 puffs into the lungs in the morning and at bedtime. 8 g 0   ibuprofen (ADVIL) 800 MG tablet TAKE 1 TABLET BY MOUTH EVERY 8 HOURS AS NEEDED 90 tablet 1   omeprazole (PRILOSEC) 20 MG capsule Take 20 mg by mouth daily.     sildenafil (VIAGRA) 50 MG tablet TAKE 1 TABLET BY MOUTH AS NEEDED 10 tablet 5   No current facility-administered medications on file prior to visit.   Past Medical History:  Diagnosis Date   Anxiety    Depression    Past Surgical History:  Procedure Laterality Date   none      Family History  Problem Relation Age of Onset   Breast cancer Paternal Grandmother    Diabetes Mother    Alzheimer's disease Father    Social History   Socioeconomic History   Marital status: Married    Spouse name: Not on file   Number of children: 2   Years of education: Not on file   Highest education level: Not on file  Occupational History   Occupation: Cabin crew  Tobacco Use   Smoking status: Every Day    Packs/day: 1.00    Pack years: 0.00    Types: Cigarettes   Smokeless tobacco: Never  Vaping Use   Vaping Use: Never used  Substance and Sexual Activity   Alcohol use: Not Currently   Drug use: Never   Sexual activity: Yes    Partners:  Female  Other Topics Concern   Not on file  Social History Narrative   Not on file   Social Determinants of Health   Financial Resource Strain: Not on file  Food Insecurity: Not on file  Transportation Needs: Not on file  Physical Activity: Not on file  Stress: Not on file  Social Connections: Not on file    Review of Systems CONSTITUTIONAL: Negative for chills, fatigue, fever, unintentional weight gain and unintentional weight loss.  CARDIOVASCULAR: Negative for chest pain, dizziness, palpitations and pedal edema.  RESPIRATORY: Negative for recent cough and dyspnea.  GASTROINTESTINAL: Negative for abdominal pain, acid reflux symptoms, constipation, diarrhea, nausea and vomiting.   PSYCHIATRIC: Negative for sleep disturbance and to question depression screen.  Negative for depression, negative for anhedonia.       Objective:  BP 110/72 (BP Location: Left Arm, Patient Position: Sitting, Cuff Size: Normal)   Pulse 77   Temp 97.6 F (36.4 C) (Temporal)   Ht 5\' 11"  (1.803 m)   Wt 179 lb (81.2 kg)   SpO2 96%   BMI 24.97 kg/m   BP/Weight 02/10/2021 08/17/2020 78/46/9629  Systolic BP 528 - 413  Diastolic BP 72 - 78  Wt. (Lbs) 179 185 185  BMI 24.97 25.8 25.8  Physical Exam PHYSICAL EXAM:   VS: BP 110/72 (BP Location: Left Arm, Patient Position: Sitting, Cuff Size: Normal)   Pulse 77   Temp 97.6 F (36.4 C) (Temporal)   Ht 5\' 11"  (1.803 m)   Wt 179 lb (81.2 kg)   SpO2 96%   BMI 24.97 kg/m   GEN: Well nourished, well developed, in no acute distress  Cardiac: RRR; no murmurs, rubs, or gallops,no edema - no significant varicosities Respiratory:  normal respiratory rate and pattern with no distress - normal breath sounds with no rales, rhonchi, wheezes or rubs Psych: euthymic mood, appropriate affect and demeanor  Diabetic Foot Exam - Simple   No data filed      Lab Results  Component Value Date   WBC 6.9 08/11/2020   HGB 13.4 08/11/2020   HCT 39.0 08/11/2020    PLT 240 08/11/2020   GLUCOSE 105 (H) 08/11/2020   CHOL 167 08/11/2020   TRIG 71 08/11/2020   HDL 38 (L) 08/11/2020   LDLCALC 115 (H) 08/11/2020   ALT 31 08/11/2020   AST 25 08/11/2020   NA 140 08/11/2020   K 4.1 08/11/2020   CL 105 08/11/2020   CREATININE 0.89 08/11/2020   BUN 14 08/11/2020   CO2 22 08/11/2020      Assessment & Plan:    1 hyperlipidemia Continue to watch diet Labwork pending  2 GERD Continue prilosec  3 Sexual dysfunction Continue viagra as needed  No orders of the defined types were placed in this encounter.   Orders Placed This Encounter  Procedures   CBC with Differential/Platelet   Comprehensive metabolic panel   Lipid panel     Follow-up: Return in about 6 months (around 08/12/2021) for chronic fasting follow up.  An After Visit Summary was printed and given to the patient.  Yetta Flock [facility] [phone]

## 2021-02-11 ENCOUNTER — Other Ambulatory Visit: Payer: Self-pay | Admitting: Physician Assistant

## 2021-02-11 LAB — COMPREHENSIVE METABOLIC PANEL
ALT: 26 IU/L (ref 0–44)
AST: 17 IU/L (ref 0–40)
Albumin/Globulin Ratio: 2.8 — ABNORMAL HIGH (ref 1.2–2.2)
Albumin: 4.7 g/dL (ref 3.8–4.9)
Alkaline Phosphatase: 80 IU/L (ref 44–121)
BUN/Creatinine Ratio: 17 (ref 9–20)
BUN: 16 mg/dL (ref 6–24)
Bilirubin Total: 0.4 mg/dL (ref 0.0–1.2)
CO2: 20 mmol/L (ref 20–29)
Calcium: 8.6 mg/dL — ABNORMAL LOW (ref 8.7–10.2)
Chloride: 107 mmol/L — ABNORMAL HIGH (ref 96–106)
Creatinine, Ser: 0.92 mg/dL (ref 0.76–1.27)
Globulin, Total: 1.7 g/dL (ref 1.5–4.5)
Glucose: 105 mg/dL — ABNORMAL HIGH (ref 65–99)
Potassium: 4.5 mmol/L (ref 3.5–5.2)
Sodium: 140 mmol/L (ref 134–144)
Total Protein: 6.4 g/dL (ref 6.0–8.5)
eGFR: 101 mL/min/{1.73_m2} (ref >59)

## 2021-02-11 LAB — CBC WITH DIFFERENTIAL/PLATELET
Basophils Absolute: 0.1 10*3/uL (ref 0.0–0.2)
Basos: 1 %
EOS (ABSOLUTE): 0.3 10*3/uL (ref 0.0–0.4)
Eos: 5 %
Hematocrit: 43.1 % (ref 37.5–51.0)
Hemoglobin: 14.8 g/dL (ref 13.0–17.7)
Immature Grans (Abs): 0 10*3/uL (ref 0.0–0.1)
Immature Granulocytes: 1 %
Lymphocytes Absolute: 1.4 10*3/uL (ref 0.7–3.1)
Lymphs: 22 %
MCH: 31.2 pg (ref 26.6–33.0)
MCHC: 34.3 g/dL (ref 31.5–35.7)
MCV: 91 fL (ref 79–97)
Monocytes Absolute: 0.8 10*3/uL (ref 0.1–0.9)
Monocytes: 13 %
Neutrophils Absolute: 3.8 10*3/uL (ref 1.4–7.0)
Neutrophils: 58 %
Platelets: 256 10*3/uL (ref 150–450)
RBC: 4.75 x10E6/uL (ref 4.14–5.80)
RDW: 12.6 % (ref 11.6–15.4)
WBC: 6.4 10*3/uL (ref 3.4–10.8)

## 2021-02-11 LAB — LIPID PANEL
Chol/HDL Ratio: 4.7 ratio (ref 0.0–5.0)
Cholesterol, Total: 173 mg/dL (ref 100–199)
HDL: 37 mg/dL — ABNORMAL LOW (ref >39)
LDL Chol Calc (NIH): 124 mg/dL — ABNORMAL HIGH (ref 0–99)
Triglycerides: 64 mg/dL (ref 0–149)
VLDL Cholesterol Cal: 12 mg/dL (ref 5–40)

## 2021-02-11 LAB — CARDIOVASCULAR RISK ASSESSMENT

## 2021-02-11 MED ORDER — LEVALBUTEROL TARTRATE 45 MCG/ACT IN AERO: 2.0000 | INHALATION_SPRAY | Freq: Four times a day (QID) | RESPIRATORY_TRACT | 12 refills | Status: DC | PRN

## 2021-02-18 ENCOUNTER — Other Ambulatory Visit: Payer: Self-pay | Admitting: Family Medicine

## 2021-02-18 MED ORDER — ALBUTEROL SULFATE HFA 108 (90 BASE) MCG/ACT IN AERS: 2.0000 | INHALATION_SPRAY | Freq: Four times a day (QID) | RESPIRATORY_TRACT | 2 refills | Status: DC | PRN

## 2021-04-18 ENCOUNTER — Other Ambulatory Visit: Payer: Self-pay | Admitting: Physician Assistant

## 2021-06-23 ENCOUNTER — Other Ambulatory Visit: Payer: Self-pay | Admitting: Physician Assistant

## 2021-08-19 ENCOUNTER — Other Ambulatory Visit: Payer: Self-pay

## 2021-08-19 ENCOUNTER — Encounter: Payer: Self-pay | Admitting: Physician Assistant

## 2021-08-19 ENCOUNTER — Ambulatory Visit (INDEPENDENT_AMBULATORY_CARE_PROVIDER_SITE_OTHER): Payer: 59 | Admitting: Physician Assistant

## 2021-08-19 ENCOUNTER — Other Ambulatory Visit: Payer: Self-pay | Admitting: Physician Assistant

## 2021-08-19 VITALS — BP 110/78 | HR 85 | Temp 98.4°F | Ht 71.0 in | Wt 173.0 lb

## 2021-08-19 DIAGNOSIS — E782 Mixed hyperlipidemia: Secondary | ICD-10-CM

## 2021-08-19 DIAGNOSIS — Z125 Encounter for screening for malignant neoplasm of prostate: Secondary | ICD-10-CM

## 2021-08-19 DIAGNOSIS — M65341 Trigger finger, right ring finger: Secondary | ICD-10-CM

## 2021-08-19 MED ORDER — OMEPRAZOLE 20 MG PO CPDR: 20.0000 mg | DELAYED_RELEASE_CAPSULE | Freq: Every day | ORAL | 3 refills | Status: DC

## 2021-08-19 MED ORDER — SILDENAFIL CITRATE 50 MG PO TABS: ORAL_TABLET | ORAL | 5 refills | Status: DC

## 2021-08-19 NOTE — Progress Notes (Signed)
Subjective:  Patient ID: Robert Mcneil, male    DOB: 1971/08/02  Age: 51 y.o. MRN: [id number]  Chief Complaint  Patient presents with   Hyperlipidemia    Hyperlipidemia   Pt with history of hyperlipidemia - has been controlling with diet and currently not on medication for this issue - voices no concerns  Pt with history of GERD - takes otc prilosec which controls symptoms  Pt does give history of trigger finger right hand - does not want to pursue release surgery at this time Current Outpatient Medications on File Prior to Visit  Medication Sig Dispense Refill   Budeson-Glycopyrrol-Formoterol (BREZTRI AEROSPHERE) 160-9-4.8 MCG/ACT AERO Inhale 2 puffs into the lungs in the morning and at bedtime. 8 g 5   ibuprofen (ADVIL) 800 MG tablet TAKE 1 TABLET BY MOUTH EVERY 8 HOURS AS NEEDED 90 tablet 1   levalbuterol (XOPENEX HFA) 45 MCG/ACT inhaler Inhale 2 puffs into the lungs every 6 (six) hours as needed for wheezing. 1 each 12   levalbuterol (XOPENEX HFA) 45 MCG/ACT inhaler Inhale 2 puffs into the lungs every 6 (six) hours as needed for wheezing. 1 each 3   omeprazole (PRILOSEC) 20 MG capsule Take 20 mg by mouth daily.     sildenafil (VIAGRA) 50 MG tablet TAKE 1 TABLET BY MOUTH AS NEEDED 10 tablet 5   No current facility-administered medications on file prior to visit.   Past Medical History:  Diagnosis Date   Anxiety    Depression    Past Surgical History:  Procedure Laterality Date   none      Family History  Problem Relation Age of Onset   Breast cancer Paternal Grandmother    Diabetes Mother    Alzheimer's disease Father    Social History   Socioeconomic History   Marital status: Married    Spouse name: Not on file   Number of children: 2   Years of education: Not on file   Highest education level: Not on file  Occupational History   Occupation: Cabin crew  Tobacco Use   Smoking status: Every Day    Packs/day: 1.00    Types: Cigarettes   Smokeless  tobacco: Never  Vaping Use   Vaping Use: Never used  Substance and Sexual Activity   Alcohol use: Not Currently   Drug use: Never   Sexual activity: Yes    Partners: Female  Other Topics Concern   Not on file  Social History Narrative   Not on file   Social Determinants of Health   Financial Resource Strain: Not on file  Food Insecurity: Not on file  Transportation Needs: Not on file  Physical Activity: Not on file  Stress: Not on file  Social Connections: Not on file  CONSTITUTIONAL: Negative for chills, fatigue, fever, unintentional weight gain and unintentional weight loss.  CARDIOVASCULAR: Negative for chest pain, dizziness, palpitations and pedal edema.  RESPIRATORY: Negative for recent cough and dyspnea.  MSK: see HPI INTEGUMENTARY: Negative for rash.        Objective:   Diabetic Foot Exam - Simple   No data filed    PHYSICAL EXAM:   VS: BP 110/78 (BP Location: Right Arm, Patient Position: Sitting)    Pulse 85    Temp 98.4 F (36.9 C) (Temporal)    Ht 5\' 11"  (1.803 m)    Wt 173 lb (78.5 kg)    SpO2 96%    BMI 24.13 kg/m   GEN: Well nourished, well developed, in no  acute distress  Cardiac: RRR; no murmurs,  Respiratory:  normal respiratory rate and pattern with no distress - normal breath sounds with no rales, rhonchi, wheezes or rubs Psych: euthymic mood, appropriate affect and demeanor   Lab Results  Component Value Date   WBC 6.4 02/10/2021   HGB 14.8 02/10/2021   HCT 43.1 02/10/2021   PLT 256 02/10/2021   GLUCOSE 105 (H) 02/10/2021   CHOL 173 02/10/2021   TRIG 64 02/10/2021   HDL 37 (L) 02/10/2021   LDLCALC 124 (H) 02/10/2021   ALT 26 02/10/2021   AST 17 02/10/2021   NA 140 02/10/2021   K 4.5 02/10/2021   CL 107 (H) 02/10/2021   CREATININE 0.92 02/10/2021   BUN 16 02/10/2021   CO2 20 02/10/2021      Assessment & Plan:    1 hyperlipidemia Continue to watch diet Labwork pending  2 GERD Continue prilosec  3 Sexual  dysfunction Continue viagra as needed  4 trigger finger Ortho referral at pt request at later date  No orders of the defined types were placed in this encounter.    Orders Placed This Encounter  Procedures   CBC with Differential/Platelet   Comprehensive metabolic panel   Lipid panel   PSA     Follow-up: Return in about 1 year (around 08/19/2022) for fasting physical.  An After Visit Summary was printed and given to the patient.  Yetta Flock [facility] [phone]

## 2021-08-20 LAB — COMPREHENSIVE METABOLIC PANEL
ALT: 28 IU/L (ref 0–44)
AST: 19 IU/L (ref 0–40)
Albumin/Globulin Ratio: 2.6 — ABNORMAL HIGH (ref 1.2–2.2)
Albumin: 4.5 g/dL (ref 3.8–4.9)
Alkaline Phosphatase: 75 IU/L (ref 44–121)
BUN/Creatinine Ratio: 18 (ref 9–20)
BUN: 16 mg/dL (ref 6–24)
Bilirubin Total: 0.4 mg/dL (ref 0.0–1.2)
CO2: 24 mmol/L (ref 20–29)
Calcium: 9 mg/dL (ref 8.7–10.2)
Chloride: 105 mmol/L (ref 96–106)
Creatinine, Ser: 0.91 mg/dL (ref 0.76–1.27)
Globulin, Total: 1.7 g/dL (ref 1.5–4.5)
Glucose: 94 mg/dL (ref 70–99)
Potassium: 4.4 mmol/L (ref 3.5–5.2)
Sodium: 139 mmol/L (ref 134–144)
Total Protein: 6.2 g/dL (ref 6.0–8.5)
eGFR: 101 mL/min/{1.73_m2} (ref >59)

## 2021-08-20 LAB — CBC WITH DIFFERENTIAL/PLATELET
Basophils Absolute: 0.1 10*3/uL (ref 0.0–0.2)
Basos: 1 %
EOS (ABSOLUTE): 0.3 10*3/uL (ref 0.0–0.4)
Eos: 3 %
Hematocrit: 40.7 % (ref 37.5–51.0)
Hemoglobin: 14.7 g/dL (ref 13.0–17.7)
Immature Grans (Abs): 0 10*3/uL (ref 0.0–0.1)
Immature Granulocytes: 0 %
Lymphocytes Absolute: 2 10*3/uL (ref 0.7–3.1)
Lymphs: 26 %
MCH: 31.8 pg (ref 26.6–33.0)
MCHC: 36.1 g/dL — ABNORMAL HIGH (ref 31.5–35.7)
MCV: 88 fL (ref 79–97)
Monocytes Absolute: 0.9 10*3/uL (ref 0.1–0.9)
Monocytes: 12 %
Neutrophils Absolute: 4.6 10*3/uL (ref 1.4–7.0)
Neutrophils: 58 %
Platelets: 230 10*3/uL (ref 150–450)
RBC: 4.62 x10E6/uL (ref 4.14–5.80)
RDW: 11.9 % (ref 11.6–15.4)
WBC: 7.8 10*3/uL (ref 3.4–10.8)

## 2021-08-20 LAB — PSA: Prostate Specific Ag, Serum: 0.3 ng/mL (ref 0.0–4.0)

## 2021-08-20 LAB — LIPID PANEL
Chol/HDL Ratio: 4.5 ratio (ref 0.0–5.0)
Cholesterol, Total: 176 mg/dL (ref 100–199)
HDL: 39 mg/dL — ABNORMAL LOW (ref >39)
LDL Chol Calc (NIH): 124 mg/dL — ABNORMAL HIGH (ref 0–99)
Triglycerides: 70 mg/dL (ref 0–149)
VLDL Cholesterol Cal: 13 mg/dL (ref 5–40)

## 2021-08-20 LAB — CARDIOVASCULAR RISK ASSESSMENT

## 2021-08-27 ENCOUNTER — Other Ambulatory Visit: Payer: Self-pay | Admitting: Physician Assistant

## 2021-10-03 ENCOUNTER — Other Ambulatory Visit: Payer: Self-pay | Admitting: Physician Assistant

## 2021-10-30 ENCOUNTER — Other Ambulatory Visit: Payer: Self-pay | Admitting: Physician Assistant

## 2021-12-23 ENCOUNTER — Other Ambulatory Visit: Payer: Self-pay | Admitting: Physician Assistant

## 2022-02-09 ENCOUNTER — Ambulatory Visit (INDEPENDENT_AMBULATORY_CARE_PROVIDER_SITE_OTHER): Payer: 59 | Admitting: Physician Assistant

## 2022-02-09 ENCOUNTER — Encounter: Payer: Self-pay | Admitting: Physician Assistant

## 2022-02-09 VITALS — BP 122/72 | HR 86 | Temp 97.8°F | Ht 71.0 in | Wt 181.8 lb

## 2022-02-09 DIAGNOSIS — D229 Melanocytic nevi, unspecified: Secondary | ICD-10-CM

## 2022-02-09 MED ORDER — SILDENAFIL CITRATE 50 MG PO TABS: ORAL_TABLET | ORAL | 5 refills | Status: DC

## 2022-02-09 MED ORDER — IBUPROFEN 800 MG PO TABS: 800.0000 mg | ORAL_TABLET | Freq: Three times a day (TID) | ORAL | 1 refills | Status: DC | PRN

## 2022-02-09 NOTE — Progress Notes (Signed)
Acute Office Visit  Subjective:    Patient ID: Robert Mcneil, male    DOB: 1970-11-23, 51 y.o.   MRN: [id number]  Chief Complaint  Patient presents with   Mole on back    HPI: Patient is in today for complaints of mole on back that has been changing and discolored over the past few months - would like referral to dermatology  Past Medical History:  Diagnosis Date   Anxiety    Depression     Past Surgical History:  Procedure Laterality Date   none      Family History  Problem Relation Age of Onset   Breast cancer Paternal Grandmother    Diabetes Mother    Alzheimer's disease Father     Social History   Socioeconomic History   Marital status: Married    Spouse name: Not on file   Number of children: 2   Years of education: Not on file   Highest education level: Not on file  Occupational History   Occupation: Cabin crew  Tobacco Use   Smoking status: Every Day    Packs/day: 1.00    Types: Cigarettes   Smokeless tobacco: Never  Vaping Use   Vaping Use: Never used  Substance and Sexual Activity   Alcohol use: Not Currently   Drug use: Never   Sexual activity: Yes    Partners: Female  Other Topics Concern   Not on file  Social History Narrative   Not on file   Social Determinants of Health   Financial Resource Strain: Not on file  Food Insecurity: Not on file  Transportation Needs: Not on file  Physical Activity: Not on file  Stress: Not on file  Social Connections: Not on file  Intimate Partner Violence: Not on file    Outpatient Medications Prior to Visit  Medication Sig Dispense Refill   Budeson-Glycopyrrol-Formoterol (BREZTRI AEROSPHERE) 160-9-4.8 MCG/ACT AERO Inhale 2 puffs into the lungs in the morning and at bedtime. 8 g 5   Esomeprazole Magnesium (NEXIUM 24HR PO) Take 1 tablet by mouth daily.     levalbuterol (XOPENEX HFA) 45 MCG/ACT inhaler Inhale 2 puffs into the lungs every 6 (six) hours as needed for wheezing. 1 each 12    ibuprofen (ADVIL) 800 MG tablet TAKE 1 TABLET BY MOUTH EVERY 8 HOURS AS NEEDED 90 tablet 1   sildenafil (VIAGRA) 50 MG tablet TAKE 1 TABLET BY MOUTH EVERY DAY AS NEEDED 10 tablet 5   pantoprazole (PROTONIX) 40 MG tablet Take 1 tablet (40 mg total) by mouth daily. 90 tablet 3   No facility-administered medications prior to visit.    No Known Allergies  Review of Systems    CONSTITUTIONAL: Negative for chills, fatigue, fever, unintentional weight gain and unintentional weight loss.   INTEGUMENTARY: see HPI   Objective:    PHYSICAL EXAM:   VS: BP 122/72 (BP Location: Left Arm, Patient Position: Sitting)   Pulse 86   Temp 97.8 F (36.6 C) (Temporal)   Ht _0  (1.803 m)   Wt 181 lb 12.8 oz (82.5 kg)   SpO2 98%   BMI 25.36 kg/m   GEN: Well nourished, well developed, in no acute distress   Cardiac: RRR; no murmurs, rubs,  Respiratory:  normal respiratory rate and pattern with no distress - normal breath sounds with no rales, rhonchi, wheezes or rubs  Skin: lentigos noted on back but does have 2 irregular atypical nevi noted   Lab Results  Component Value Date  WBC 7.8 08/19/2021   HGB 14.7 08/19/2021   HCT 40.7 08/19/2021   MCV 88 08/19/2021   PLT 230 08/19/2021   Lab Results  Component Value Date   NA 139 08/19/2021   K 4.4 08/19/2021   CO2 24 08/19/2021   GLUCOSE 94 08/19/2021   BUN 16 08/19/2021   CREATININE 0.91 08/19/2021   BILITOT 0.4 08/19/2021   ALKPHOS 75 08/19/2021   AST 19 08/19/2021   ALT 28 08/19/2021   PROT 6.2 08/19/2021   ALBUMIN 4.5 08/19/2021   CALCIUM 9.0 08/19/2021   EGFR 101 08/19/2021   Lab Results  Component Value Date   CHOL 176 08/19/2021   Lab Results  Component Value Date   HDL 39 (L) 08/19/2021   Lab Results  Component Value Date   LDLCALC 124 (H) 08/19/2021   Lab Results  Component Value Date   TRIG 70 08/19/2021   Lab Results  Component Value Date   CHOLHDL 4.5 08/19/2021   No results found for: "HGBA1C"      Assessment & Plan:   Problem List Items Addressed This Visit   None Visit Diagnoses     Atypical nevus    -  Primary   Relevant Orders   Ambulatory referral to Dermatology      Meds ordered this encounter  Medications   ibuprofen (ADVIL) 800 MG tablet    Sig: Take 1 tablet (800 mg total) by mouth every 8 (eight) hours as needed.    Dispense:  90 tablet    Refill:  1    Order Specific Question:   Supervising Provider    Answer:   Shelton Silvas   sildenafil (VIAGRA) 50 MG tablet    Sig: TAKE 1 TABLET BY MOUTH EVERY DAY AS NEEDED    Dispense:  10 tablet    Refill:  5    Order Specific Question:   Supervising Provider    AnswerShelton Silvas    Orders Placed This Encounter  Procedures   Ambulatory referral to Dermatology     Follow-up: Return if symptoms worsen or fail to improve.  An After Visit Summary was printed and given to the patient.  Yetta Flock [facility] [phone]

## 2022-03-05 ENCOUNTER — Other Ambulatory Visit: Payer: Self-pay | Admitting: Physician Assistant

## 2022-04-06 ENCOUNTER — Other Ambulatory Visit: Payer: Self-pay | Admitting: Physician Assistant

## 2022-06-20 ENCOUNTER — Other Ambulatory Visit: Payer: Self-pay | Admitting: Physician Assistant

## 2022-07-26 ENCOUNTER — Other Ambulatory Visit: Payer: Self-pay | Admitting: Physician Assistant

## 2022-08-23 ENCOUNTER — Encounter: Payer: 59 | Admitting: Physician Assistant

## 2022-09-05 ENCOUNTER — Encounter: Payer: Self-pay | Admitting: Physician Assistant

## 2022-09-05 ENCOUNTER — Ambulatory Visit (INDEPENDENT_AMBULATORY_CARE_PROVIDER_SITE_OTHER): Payer: 59 | Admitting: Physician Assistant

## 2022-09-05 VITALS — BP 118/78 | HR 87 | Temp 97.4°F | Ht 71.0 in | Wt 183.4 lb

## 2022-09-05 DIAGNOSIS — R82998 Other abnormal findings in urine: Secondary | ICD-10-CM

## 2022-09-05 DIAGNOSIS — Z0000 Encounter for general adult medical examination without abnormal findings: Secondary | ICD-10-CM | POA: Insufficient documentation

## 2022-09-05 LAB — POCT URINALYSIS DIP (CLINITEK)
Bilirubin, UA: NEGATIVE
Blood, UA: NEGATIVE
Glucose, UA: NEGATIVE mg/dL
Ketones, POC UA: NEGATIVE mg/dL
Nitrite, UA: NEGATIVE
POC PROTEIN,UA: NEGATIVE
Spec Grav, UA: 1.01 (ref 1.010–1.025)
Urobilinogen, UA: 0.2 E.U./dL
pH, UA: 6.5 (ref 5.0–8.0)

## 2022-09-05 MED ORDER — BREZTRI AEROSPHERE 160-9-4.8 MCG/ACT IN AERO: 2.0000 | INHALATION_SPRAY | Freq: Two times a day (BID) | RESPIRATORY_TRACT | 5 refills | Status: DC

## 2022-09-05 MED ORDER — LEVALBUTEROL TARTRATE 45 MCG/ACT IN AERO: INHALATION_SPRAY | RESPIRATORY_TRACT | 12 refills | Status: DC

## 2022-09-05 MED ORDER — IBUPROFEN 800 MG PO TABS: 800.0000 mg | ORAL_TABLET | Freq: Three times a day (TID) | ORAL | 1 refills | Status: DC | PRN

## 2022-09-05 MED ORDER — SILDENAFIL CITRATE 50 MG PO TABS: 50.0000 mg | ORAL_TABLET | Freq: Every day | ORAL | 1 refills | Status: DC | PRN

## 2022-09-05 NOTE — Progress Notes (Signed)
Subjective:  Patient ID: Robert Mcneil, male    DOB: 1971-03-31  Age: 52 y.o. MRN: [id number]  Chief Complaint  Patient presents with   Annual Exam    HPI  Well Adult Physical: Patient here for a comprehensive physical exam.The patient reports no problems Do you take any herbs or supplements that were not prescribed by a doctor? no Are you taking calcium supplements? no Are you taking aspirin daily? no  Encounter for general adult medical examination without abnormal findings  Physical ("At Risk" items are starred): Patient's last physical exam was 1 year ago .  Patient wears a seat belt, has smoke detectors, has carbon monoxide detectors, practices appropriate gun safety, and wears sunscreen with extended sun exposure. Dental Care: biannual cleanings, brushes and flosses daily. Ophthalmology/Optometry: is due Hearing loss: none Vision impairments: none Last PSA: 1 year ago    09/05/2022    3:11 PM 08/19/2021   10:33 AM 08/11/2020    3:32 PM  Depression screen PHQ 2/9  Decreased Interest 0 0 0  Down, Depressed, Hopeless 0 0 0  PHQ - 2 Score 0 0 0         08/19/2021   10:33 AM 09/05/2022    3:13 PM  Fall Risk  Falls in the past year? 0 0  Was there an injury with Fall? 0 0  Fall Risk Category Calculator 0 0  Fall Risk Category (Retired) Low   (RETIRED) Patient Fall Risk Level Low fall risk   Patient at Risk for Falls Due to  No Fall Risks  Fall risk Follow up Falls evaluation completed Falls evaluation completed              Past Medical History:  Diagnosis Date   Anxiety    Depression    Past Surgical History:  Procedure Laterality Date   none      Family History  Problem Relation Age of Onset   Breast cancer Paternal Grandmother    Diabetes Mother    Alzheimer's disease Father    Social History   Socioeconomic History   Marital status: Married    Spouse name: Not on file   Number of children: 2   Years of education: Not on file   Highest  education level: Not on file  Occupational History   Occupation: Cabin crew  Tobacco Use   Smoking status: Every Day    Packs/day: 1.00    Types: Cigarettes   Smokeless tobacco: Never  Vaping Use   Vaping Use: Never used  Substance and Sexual Activity   Alcohol use: Not Currently   Drug use: Never   Sexual activity: Yes    Partners: Female  Other Topics Concern   Not on file  Social History Narrative   Not on file   Social Determinants of Health   Financial Resource Strain: Low Risk  (09/05/2022)   Overall Financial Resource Strain (CARDIA)    Difficulty of Paying Living Expenses: Not hard at all  Food Insecurity: No Food Insecurity (09/05/2022)   Hunger Vital Sign    Worried About Running Out of Food in the Last Year: Never true    [place] in the Last Year: Never true  Transportation Needs: No Transportation Needs (09/05/2022)   PRAPARE - Hydrologist (Medical): No    Lack of Transportation (Non-Medical): No  Physical Activity: Insufficiently Active (09/05/2022)   Exercise Vital Sign    Days of Exercise per Week:  4 days    Minutes of Exercise per Session: 30 min  Stress: No Stress Concern Present (09/05/2022)   [place]    Feeling of Stress : Only a little  Social Connections: Moderately Integrated (09/05/2022)   Social Connection and Isolation Panel [NHANES]    Frequency of Communication with Friends and Family: More than three times a week    Frequency of Social Gatherings with Friends and Family: Twice a week    Attends Religious Services: More than 4 times per year    Active Member of [employer] or Organizations: No    Attends Archivist Meetings: Never    Marital Status: Married   Review of Systems  CONSTITUTIONAL: Negative for chills, fatigue, fever, unintentional weight gain and unintentional weight loss.  E/N/T: Negative for ear pain, nasal congestion  and sore throat.  CARDIOVASCULAR: Negative for chest pain, dizziness, palpitations and pedal edema.  RESPIRATORY: Negative for recent cough and dyspnea.  GASTROINTESTINAL: Negative for abdominal pain, acid reflux symptoms, constipation, diarrhea, nausea and vomiting.  MSK: Negative for arthralgias and myalgias.  INTEGUMENTARY: Negative for rash.  NEUROLOGICAL: Negative for dizziness and headaches.  PSYCHIATRIC: Negative for sleep disturbance and to question depression screen.  Negative for depression, negative for anhedonia.      Objective:  PHYSICAL EXAM:   VS: BP 118/78 (BP Location: Left Arm, Patient Position: Sitting, Cuff Size: Normal)   Pulse 87   Temp (!) 97.4 F (36.3 C) (Temporal)   Ht '5\' 11"'$  (1.803 m)   Wt 183 lb 6.4 oz (83.2 kg)   SpO2 97%   BMI 25.58 kg/m  Vision Screening   Right eye Left eye Both eyes  Without correction '20/20 20/25 20/20 '$  With correction      GEN: Well nourished, well developed, in no acute distress  HEENT: normal external ears and nose - normal external auditory canals and TMS - hearing grossly normal -- Lips, Teeth and Gums - normal  Oropharynx - normal mucosa, palate, and posterior pharynx Neck: no JVD or masses - no thyromegaly Cardiac: RRR; no murmurs, rubs, or gallops,no edema -  Respiratory:  normal respiratory rate and pattern with no distress - normal breath sounds with no rales, rhonchi, wheezes or rubs GI: normal bowel sounds, no masses or tenderness MS: no deformity or atrophy  Skin: warm and dry, no rash  Neuro:  Alert and Oriented x 3, Strength and sensation are intact - CN II-Xii grossly intact Psych: euthymic mood, appropriate affect and demeanor  Office Visit on 09/05/2022  Component Date Value Ref Range Status   Color, UA 09/05/2022 yellow  yellow Final   Clarity, UA 09/05/2022 clear  clear Final   Glucose, UA 09/05/2022 negative  negative mg/dL Final   Bilirubin, UA 09/05/2022 negative  negative Final   Ketones, POC UA  09/05/2022 negative  negative mg/dL Final   Spec Grav, UA 09/05/2022 1.010  1.010 - 1.025 Final   Blood, UA 09/05/2022 negative  negative Final   pH, UA 09/05/2022 6.5  5.0 - 8.0 Final   POC PROTEIN,UA 09/05/2022 negative  negative, trace Final   Urobilinogen, UA 09/05/2022 0.2  0.2 or 1.0 E.U./dL Final   Nitrite, UA 09/05/2022 Negative  Negative Final   Leukocytes, UA 09/05/2022 Small (1+) (A)  Negative Final     Lab Results  Component Value Date   WBC 7.8 08/19/2021   HGB 14.7 08/19/2021   HCT 40.7 08/19/2021   PLT 230  08/19/2021   GLUCOSE 94 08/19/2021   CHOL 176 08/19/2021   TRIG 70 08/19/2021   HDL 39 (L) 08/19/2021   LDLCALC 124 (H) 08/19/2021   ALT 28 08/19/2021   AST 19 08/19/2021   NA 139 08/19/2021   K 4.4 08/19/2021   CL 105 08/19/2021   CREATININE 0.91 08/19/2021   BUN 16 08/19/2021   CO2 24 08/19/2021      Assessment & Plan:  Annual physical exam Labwork pending Prev wellness handout given    Body mass index is 25.58 kg/m.   These are the goals we discussed:  Goals   None      This is a list of the screening recommended for you and due dates:  Health Maintenance  Topic Date Due   Flu Shot  11/12/2022*   DTaP/Tdap/Td vaccine (2 - Td or Tdap) 01/25/2030   Colon Cancer Screening  10/07/2030   HPV Vaccine  Aged Out   COVID-19 Vaccine  Discontinued   Hepatitis C Screening: USPSTF Recommendation to screen - Ages 24-79 yo.  Discontinued   HIV Screening  Discontinued   Zoster (Shingles) Vaccine  Discontinued  *Topic was postponed. The date shown is not the original due date.     Meds ordered this encounter  Medications   Budeson-Glycopyrrol-Formoterol (BREZTRI AEROSPHERE) 160-9-4.8 MCG/ACT AERO    Sig: Inhale 2 puffs into the lungs in the morning and at bedtime.    Dispense:  1 each    Refill:  5    Order Specific Question:   Supervising Provider    Answer:   COX, Lyn[facility] Parents   levalbuterol (XOPENEX HFA) 45 MCG/ACT inhaler    Sig:  INHALE 2 PUFFS INTO THE LUNGS EVERY 6 HOURS AS NEEDED FOR WHEEZE    Dispense:  1 each    Refill:  12    Order Specific Question:   Supervising Provider    Answer:   Shelton Silvas   sildenafil (VIAGRA) 50 MG tablet    Sig: Take 1 tablet (50 mg total) by mouth daily as needed.    Dispense:  10 tablet    Refill:  1    Order Specific Question:   Supervising Provider    Answer:   Shelton Silvas   ibuprofen (ADVIL) 800 MG tablet    Sig: Take 1 tablet (800 mg total) by mouth every 8 (eight) hours as needed.    Dispense:  90 tablet    Refill:  1    Order Specific Question:   Supervising Provider    Answer:   Shelton Silvas     Follow-up: Return in about 1 year (around 09/06/2023) for fasting physical.  An After Visit Summary was printed and given to the patient.  Yetta Flock [facility] [phone]

## 2022-09-05 NOTE — Assessment & Plan Note (Signed)
Labwork pending Prev wellness handout given

## 2022-09-06 LAB — CBC WITH DIFFERENTIAL/PLATELET
Basophils Absolute: 0.1 10*3/uL (ref 0.0–0.2)
Basos: 1 %
EOS (ABSOLUTE): 0.2 10*3/uL (ref 0.0–0.4)
Eos: 2 %
Hematocrit: 41.9 % (ref 37.5–51.0)
Hemoglobin: 14.6 g/dL (ref 13.0–17.7)
Immature Grans (Abs): 0 10*3/uL (ref 0.0–0.1)
Immature Granulocytes: 0 %
Lymphocytes Absolute: 2.3 10*3/uL (ref 0.7–3.1)
Lymphs: 27 %
MCH: 31.7 pg (ref 26.6–33.0)
MCHC: 34.8 g/dL (ref 31.5–35.7)
MCV: 91 fL (ref 79–97)
Monocytes Absolute: 1 10*3/uL — ABNORMAL HIGH (ref 0.1–0.9)
Monocytes: 12 %
Neutrophils Absolute: 5 10*3/uL (ref 1.4–7.0)
Neutrophils: 58 %
Platelets: 263 10*3/uL (ref 150–450)
RBC: 4.61 x10E6/uL (ref 4.14–5.80)
RDW: 12.8 % (ref 11.6–15.4)
WBC: 8.6 10*3/uL (ref 3.4–10.8)

## 2022-09-06 LAB — COMPREHENSIVE METABOLIC PANEL
ALT: 38 IU/L (ref 0–44)
AST: 22 IU/L (ref 0–40)
Albumin/Globulin Ratio: 2.2 (ref 1.2–2.2)
Albumin: 4.7 g/dL (ref 3.8–4.9)
Alkaline Phosphatase: 80 IU/L (ref 44–121)
BUN/Creatinine Ratio: 18 (ref 9–20)
BUN: 18 mg/dL (ref 6–24)
Bilirubin Total: 0.3 mg/dL (ref 0.0–1.2)
CO2: 21 mmol/L (ref 20–29)
Calcium: 9.4 mg/dL (ref 8.7–10.2)
Chloride: 104 mmol/L (ref 96–106)
Creatinine, Ser: 1.02 mg/dL (ref 0.76–1.27)
Globulin, Total: 2.1 g/dL (ref 1.5–4.5)
Glucose: 87 mg/dL (ref 70–99)
Potassium: 4.4 mmol/L (ref 3.5–5.2)
Sodium: 143 mmol/L (ref 134–144)
Total Protein: 6.8 g/dL (ref 6.0–8.5)
eGFR: 88 mL/min/{1.73_m2} (ref >59)

## 2022-09-06 LAB — LIPID PANEL
Chol/HDL Ratio: 4.1 ratio (ref 0.0–5.0)
Cholesterol, Total: 189 mg/dL (ref 100–199)
HDL: 46 mg/dL (ref >39)
LDL Chol Calc (NIH): 130 mg/dL — ABNORMAL HIGH (ref 0–99)
Triglycerides: 70 mg/dL (ref 0–149)
VLDL Cholesterol Cal: 13 mg/dL (ref 5–40)

## 2022-09-06 LAB — PSA: Prostate Specific Ag, Serum: 0.4 ng/mL (ref 0.0–4.0)

## 2022-09-06 LAB — CARDIOVASCULAR RISK ASSESSMENT

## 2022-09-06 LAB — TSH: TSH: 2.27 u[IU]/mL (ref 0.450–4.500)

## 2022-09-10 ENCOUNTER — Other Ambulatory Visit: Payer: Self-pay | Admitting: Physician Assistant

## 2022-09-10 LAB — URINE CULTURE

## 2022-09-10 MED ORDER — CIPROFLOXACIN HCL 500 MG PO TABS: 500.0000 mg | ORAL_TABLET | Freq: Two times a day (BID) | ORAL | 0 refills | Status: AC

## 2022-09-11 ENCOUNTER — Other Ambulatory Visit: Payer: Self-pay | Admitting: Physician Assistant

## 2022-09-11 DIAGNOSIS — E782 Mixed hyperlipidemia: Secondary | ICD-10-CM

## 2022-09-11 MED ORDER — PRAVASTATIN SODIUM 20 MG PO TABS: 20.0000 mg | ORAL_TABLET | Freq: Every day | ORAL | 1 refills | Status: DC

## 2022-09-29 ENCOUNTER — Ambulatory Visit (INDEPENDENT_AMBULATORY_CARE_PROVIDER_SITE_OTHER): Payer: 59

## 2022-09-29 DIAGNOSIS — R82998 Other abnormal findings in urine: Secondary | ICD-10-CM | POA: Diagnosis not present

## 2022-09-29 LAB — POCT URINALYSIS DIP (CLINITEK)
Bilirubin, UA: NEGATIVE
Blood, UA: NEGATIVE
Glucose, UA: NEGATIVE mg/dL
Ketones, POC UA: NEGATIVE mg/dL
Leukocytes, UA: NEGATIVE
Nitrite, UA: NEGATIVE
Spec Grav, UA: 1.015 (ref 1.010–1.025)
Urobilinogen, UA: 0.2 E.U./dL
pH, UA: 6.5 (ref 5.0–8.0)

## 2022-09-29 NOTE — Progress Notes (Signed)
Patient came in for repeat UA.   Provider made aware of results, Per Marge Duncans, PA-C : UA normal follow up as schedule.

## 2022-12-06 ENCOUNTER — Other Ambulatory Visit: Payer: 59

## 2022-12-08 ENCOUNTER — Other Ambulatory Visit (INDEPENDENT_AMBULATORY_CARE_PROVIDER_SITE_OTHER): Payer: 59

## 2022-12-08 DIAGNOSIS — E782 Mixed hyperlipidemia: Secondary | ICD-10-CM

## 2022-12-08 LAB — COMPREHENSIVE METABOLIC PANEL
ALT: 36 IU/L (ref 0–44)
AST: 23 IU/L (ref 0–40)
Albumin/Globulin Ratio: 2 (ref 1.2–2.2)
Albumin: 4.3 g/dL (ref 3.8–4.9)
Alkaline Phosphatase: 79 IU/L (ref 44–121)
BUN/Creatinine Ratio: 13 (ref 9–20)
BUN: 12 mg/dL (ref 6–24)
Bilirubin Total: 0.4 mg/dL (ref 0.0–1.2)
CO2: 19 mmol/L — ABNORMAL LOW (ref 20–29)
Calcium: 8.9 mg/dL (ref 8.7–10.2)
Chloride: 105 mmol/L (ref 96–106)
Creatinine, Ser: 0.9 mg/dL (ref 0.76–1.27)
Globulin, Total: 2.1 g/dL (ref 1.5–4.5)
Glucose: 101 mg/dL — ABNORMAL HIGH (ref 70–99)
Potassium: 5 mmol/L (ref 3.5–5.2)
Sodium: 141 mmol/L (ref 134–144)
Total Protein: 6.4 g/dL (ref 6.0–8.5)
eGFR: 102 mL/min/{1.73_m2} (ref >59)

## 2022-12-11 ENCOUNTER — Other Ambulatory Visit: Payer: Self-pay | Admitting: Physician Assistant

## 2022-12-11 DIAGNOSIS — E782 Mixed hyperlipidemia: Secondary | ICD-10-CM

## 2022-12-12 LAB — LIPID PANEL
Chol/HDL Ratio: 4.8 ratio (ref 0.0–5.0)
Cholesterol, Total: 171 mg/dL (ref 100–199)
HDL: 36 mg/dL — ABNORMAL LOW (ref >39)
LDL Chol Calc (NIH): 124 mg/dL — ABNORMAL HIGH (ref 0–99)
Triglycerides: 58 mg/dL (ref 0–149)
VLDL Cholesterol Cal: 11 mg/dL (ref 5–40)

## 2022-12-12 LAB — CARDIOVASCULAR RISK ASSESSMENT

## 2022-12-12 LAB — SPECIMEN STATUS REPORT

## 2022-12-14 ENCOUNTER — Other Ambulatory Visit: Payer: Self-pay | Admitting: Physician Assistant

## 2022-12-14 DIAGNOSIS — E782 Mixed hyperlipidemia: Secondary | ICD-10-CM

## 2022-12-14 MED ORDER — PRAVASTATIN SODIUM 40 MG PO TABS
40.0000 mg | ORAL_TABLET | Freq: Every day | ORAL | 1 refills | Status: DC
Start: 2022-12-14 — End: 2023-04-13

## 2023-02-03 ENCOUNTER — Other Ambulatory Visit: Payer: Self-pay | Admitting: Physician Assistant

## 2023-03-07 ENCOUNTER — Other Ambulatory Visit: Payer: Self-pay | Admitting: Physician Assistant

## 2023-04-04 ENCOUNTER — Other Ambulatory Visit: Payer: Self-pay | Admitting: Physician Assistant

## 2023-04-13 ENCOUNTER — Ambulatory Visit (INDEPENDENT_AMBULATORY_CARE_PROVIDER_SITE_OTHER): Payer: 59 | Admitting: Physician Assistant

## 2023-04-13 ENCOUNTER — Encounter: Payer: Self-pay | Admitting: Physician Assistant

## 2023-04-13 ENCOUNTER — Other Ambulatory Visit: Payer: Self-pay | Admitting: Physician Assistant

## 2023-04-13 VITALS — BP 108/80 | HR 86 | Temp 97.9°F | Ht 71.0 in | Wt 187.0 lb

## 2023-04-13 DIAGNOSIS — K219 Gastro-esophageal reflux disease without esophagitis: Secondary | ICD-10-CM

## 2023-04-13 DIAGNOSIS — M4802 Spinal stenosis, cervical region: Secondary | ICD-10-CM

## 2023-04-13 DIAGNOSIS — E782 Mixed hyperlipidemia: Secondary | ICD-10-CM

## 2023-04-13 DIAGNOSIS — R37 Sexual dysfunction, unspecified: Secondary | ICD-10-CM

## 2023-04-13 MED ORDER — PRAVASTATIN SODIUM 40 MG PO TABS
40.0000 mg | ORAL_TABLET | Freq: Every day | ORAL | 1 refills | Status: DC
Start: 2023-04-13 — End: 2023-04-17

## 2023-04-13 MED ORDER — PANTOPRAZOLE SODIUM 40 MG PO TBEC
40.0000 mg | DELAYED_RELEASE_TABLET | Freq: Every day | ORAL | 1 refills | Status: DC
Start: 2023-04-13 — End: 2023-04-17

## 2023-04-13 MED ORDER — IBUPROFEN 800 MG PO TABS
800.0000 mg | ORAL_TABLET | Freq: Three times a day (TID) | ORAL | 1 refills | Status: DC | PRN
Start: 2023-04-13 — End: 2023-07-02

## 2023-04-13 MED ORDER — SILDENAFIL CITRATE 50 MG PO TABS: 50.0000 mg | ORAL_TABLET | Freq: Every day | ORAL | 3 refills | Status: DC | PRN

## 2023-04-13 MED ORDER — BREZTRI AEROSPHERE 160-9-4.8 MCG/ACT IN AERO: 2.0000 | INHALATION_SPRAY | Freq: Two times a day (BID) | RESPIRATORY_TRACT | 5 refills | Status: DC

## 2023-04-13 MED ORDER — LEVALBUTEROL TARTRATE 45 MCG/ACT IN AERO: INHALATION_SPRAY | RESPIRATORY_TRACT | 12 refills | Status: DC

## 2023-04-13 NOTE — Progress Notes (Signed)
Subjective:  Patient ID: Robert Mcneil, male    DOB: 09/14/1970  Age: 52 y.o. MRN: [id number]  Chief Complaint  Patient presents with   Medical Management of Chronic Issues    Hyperlipidemia    Pt with history of hyperlipidemia - pt states that he is taking pravachol 40mg  qd - due for labwork  Pt with history of GERD - takes otc nexium but is having breakthrough symptoms and lots of reflux at night - would like to try new medication  Pt has cervical stenosis - did have surgery which relieved most of symptoms - does use ibuprofen prn  Pt with sexual dysfunction and uses viagra as needed - requests refill No current outpatient medications on file prior to visit.   No current facility-administered medications on file prior to visit.   Past Medical History:  Diagnosis Date   Anxiety    Depression    Past Surgical History:  Procedure Laterality Date   none      Family History  Problem Relation Age of Onset   Breast cancer Paternal Grandmother    Diabetes Mother    Alzheimer's disease Father    Social History   Socioeconomic History   Marital status: Married    Spouse name: Not on file   Number of children: 2   Years of education: Not on file   Highest education level: Not on file  Occupational History   Occupation: Journalist, newspaper  Tobacco Use   Smoking status: Every Day    Current packs/day: 1.00    Types: Cigarettes   Smokeless tobacco: Never  Vaping Use   Vaping status: Never Used  Substance and Sexual Activity   Alcohol use: Not Currently   Drug use: Never   Sexual activity: Yes    Partners: Female  Other Topics Concern   Not on file  Social History Narrative   Not on file   Social Determinants of Health   Financial Resource Strain: Low Risk  (09/05/2022)   Overall Financial Resource Strain (CARDIA)    Difficulty of Paying Living Expenses: Not hard at all  Food Insecurity: No Food Insecurity (09/05/2022)   Hunger Vital Sign    Worried About  Running Out of Food in the Last Year: Never true    Ran Out of Food in the Last Year: Never true  Transportation Needs: No Transportation Needs (09/05/2022)   PRAPARE - Administrator, Civil Service (Medical): No    Lack of Transportation (Non-Medical): No  Physical Activity: Insufficiently Active (09/05/2022)   Exercise Vital Sign    Days of Exercise per Week: 4 days    Minutes of Exercise per Session: 30 min  Stress: No Stress Concern Present (09/05/2022)   [employer] of Occupational Health - Occupational Stress Questionnaire    Feeling of Stress : Only a little  Social Connections: Moderately Integrated (09/05/2022)   Social Connection and Isolation Panel [NHANES]    Frequency of Communication with Friends and Family: More than three times a week    Frequency of Social Gatherings with Friends and Family: Twice a week    Attends Religious Services: More than 4 times per year    Active Member of [employer] or Organizations: No    Attends Banker Meetings: Never    Marital Status: Married  CONSTITUTIONAL: Negative for chills, fatigue, fever, unintentional weight gain and unintentional weight loss.  E/N/T: Negative for ear pain, nasal congestion and sore throat.  CARDIOVASCULAR: Negative for chest  pain, dizziness, palpitations and pedal edema.  RESPIRATORY: Negative for recent cough and dyspnea.  GASTROINTESTINAL: see HPI MSK: see HPI PSYCHIATRIC: Negative for sleep disturbance and to question depression screen.  Negative for depression, negative for anhedonia.        Objective:  PHYSICAL EXAM:   VS: BP 108/80 (BP Location: Left Arm, Patient Position: Sitting, Cuff Size: Large)   Pulse 86   Temp 97.9 F (36.6 C) (Temporal)   Ht 5\' 11"  (1.803 m)   Wt 187 lb (84.8 kg)   SpO2 98%   BMI 26.08 kg/m   GEN: Well nourished, well developed, in no acute distress  Cardiac: RRR; no murmurs, rubs, or gallops,no edema -  Respiratory:  normal respiratory rate and  pattern with no distress - normal breath sounds with no rales, rhonchi, wheezes or rubs GI: normal bowel sounds, no masses or tenderness MS: no deformity or atrophy  Psych: euthymic mood, appropriate affect and demeanor    Lab Results  Component Value Date   WBC 8.6 09/05/2022   HGB 14.6 09/05/2022   HCT 41.9 09/05/2022   PLT 263 09/05/2022   GLUCOSE 101 (H) 12/08/2022   CHOL 171 12/08/2022   TRIG 58 12/08/2022   HDL 36 (L) 12/08/2022   LDLCALC 124 (H) 12/08/2022   ALT 36 12/08/2022   AST 23 12/08/2022   NA 141 12/08/2022   K 5.0 12/08/2022   CL 105 12/08/2022   CREATININE 0.90 12/08/2022   BUN 12 12/08/2022   CO2 19 (L) 12/08/2022   TSH 2.270 09/05/2022      Assessment & Plan:    1 hyperlipidemia Continue to watch diet Continue pravachol Labwork pending  2 GERD Rx for protonix 40mg  qd  3 Sexual dysfunction Continue viagra as needed    Meds ordered this encounter  Medications   pantoprazole (PROTONIX) 40 MG tablet    Sig: Take 1 tablet (40 mg total) by mouth daily.    Dispense:  90 tablet    Refill:  1    Order Specific Question:   Supervising Provider    Answer:   Corey Harold   Budeson-Glycopyrrol-Formoterol (BREZTRI AEROSPHERE) 160-9-4.8 MCG/ACT AERO    Sig: Inhale 2 puffs into the lungs in the morning and at bedtime.    Dispense:  1 each    Refill:  5    Order Specific Question:   Supervising Provider    Answer:   Corey Harold   ibuprofen (ADVIL) 800 MG tablet    Sig: Take 1 tablet (800 mg total) by mouth every 8 (eight) hours as needed.    Dispense:  90 tablet    Refill:  1    Order Specific Question:   Supervising Provider    Answer:   COX, Aniceto Boss   levalbuterol (XOPENEX HFA) 45 MCG/ACT inhaler    Sig: INHALE 2 PUFFS INTO THE LUNGS EVERY 6 HOURS AS NEEDED FOR WHEEZE    Dispense:  1 each    Refill:  12    Order Specific Question:   Supervising Provider    Answer:   Corey Harold   pravastatin (PRAVACHOL)  40 MG tablet    Sig: Take 1 tablet (40 mg total) by mouth daily.    Dispense:  90 tablet    Refill:  1    Order Specific Question:   Supervising Provider    Answer:   Corey Harold   sildenafil (VIAGRA) 50 MG tablet    Sig: Take  1 tablet (50 mg total) by mouth daily as needed.    Dispense:  10 tablet    Refill:  3    Order Specific Question:   Supervising Provider    AnswerCorey Harold     Orders Placed This Encounter  Procedures   CBC with Differential/Platelet   Comprehensive metabolic panel   Lipid panel     Follow-up: Return in about 6 months (around 10/12/2023) for fasting physical - 20 min.  An After Visit Summary was printed and given to the patient.  Jettie Pagan [facility] [phone]

## 2023-04-14 LAB — COMPREHENSIVE METABOLIC PANEL
ALT: 32 IU/L (ref 0–44)
AST: 20 IU/L (ref 0–40)
Albumin: 4.4 g/dL (ref 3.8–4.9)
Alkaline Phosphatase: 78 IU/L (ref 44–121)
BUN/Creatinine Ratio: 20 (ref 9–20)
BUN: 19 mg/dL (ref 6–24)
Bilirubin Total: 0.4 mg/dL (ref 0.0–1.2)
CO2: 24 mmol/L (ref 20–29)
Calcium: 9.5 mg/dL (ref 8.7–10.2)
Chloride: 103 mmol/L (ref 96–106)
Creatinine, Ser: 0.96 mg/dL (ref 0.76–1.27)
Globulin, Total: 1.9 g/dL (ref 1.5–4.5)
Glucose: 106 mg/dL — ABNORMAL HIGH (ref 70–99)
Potassium: 4.7 mmol/L (ref 3.5–5.2)
Sodium: 141 mmol/L (ref 134–144)
Total Protein: 6.3 g/dL (ref 6.0–8.5)
eGFR: 95 mL/min/{1.73_m2} (ref >59)

## 2023-04-14 LAB — CBC WITH DIFFERENTIAL/PLATELET
Basophils Absolute: 0.1 10*3/uL (ref 0.0–0.2)
Basos: 1 %
EOS (ABSOLUTE): 0.3 10*3/uL (ref 0.0–0.4)
Eos: 4 %
Hematocrit: 43 % (ref 37.5–51.0)
Hemoglobin: 14.5 g/dL (ref 13.0–17.7)
Immature Grans (Abs): 0 10*3/uL (ref 0.0–0.1)
Immature Granulocytes: 1 %
Lymphocytes Absolute: 1.5 10*3/uL (ref 0.7–3.1)
Lymphs: 22 %
MCH: 30.7 pg (ref 26.6–33.0)
MCHC: 33.7 g/dL (ref 31.5–35.7)
MCV: 91 fL (ref 79–97)
Monocytes Absolute: 0.9 10*3/uL (ref 0.1–0.9)
Monocytes: 13 %
Neutrophils Absolute: 3.9 10*3/uL (ref 1.4–7.0)
Neutrophils: 59 %
Platelets: 266 10*3/uL (ref 150–450)
RBC: 4.73 x10E6/uL (ref 4.14–5.80)
RDW: 12.4 % (ref 11.6–15.4)
WBC: 6.6 10*3/uL (ref 3.4–10.8)

## 2023-04-14 LAB — LIPID PANEL
Chol/HDL Ratio: 5.1 ratio — ABNORMAL HIGH (ref 0.0–5.0)
Cholesterol, Total: 184 mg/dL (ref 100–199)
HDL: 36 mg/dL — ABNORMAL LOW (ref >39)
LDL Chol Calc (NIH): 130 mg/dL — ABNORMAL HIGH (ref 0–99)
Triglycerides: 99 mg/dL (ref 0–149)
VLDL Cholesterol Cal: 18 mg/dL (ref 5–40)

## 2023-04-17 ENCOUNTER — Other Ambulatory Visit: Payer: Self-pay | Admitting: Physician Assistant

## 2023-04-17 DIAGNOSIS — K219 Gastro-esophageal reflux disease without esophagitis: Secondary | ICD-10-CM

## 2023-04-17 MED ORDER — ATORVASTATIN CALCIUM 20 MG PO TABS: 20.0000 mg | ORAL_TABLET | Freq: Every day | ORAL | 1 refills | Status: DC

## 2023-04-17 MED ORDER — LANSOPRAZOLE 30 MG PO CPDR
30.0000 mg | DELAYED_RELEASE_CAPSULE | Freq: Every day | ORAL | 3 refills | Status: DC
Start: 2023-04-17 — End: 2023-08-01

## 2023-04-17 NOTE — Telephone Encounter (Signed)
Notify pt pharmacy sent request to change protonix - it is not on his formulary - will try another med

## 2023-04-18 ENCOUNTER — Other Ambulatory Visit: Payer: Self-pay | Admitting: Physician Assistant

## 2023-04-18 DIAGNOSIS — K219 Gastro-esophageal reflux disease without esophagitis: Secondary | ICD-10-CM

## 2023-04-18 NOTE — Telephone Encounter (Signed)
Patient informed and will try a different medication.

## 2023-07-02 ENCOUNTER — Other Ambulatory Visit: Payer: Self-pay | Admitting: Physician Assistant

## 2023-07-02 DIAGNOSIS — M4802 Spinal stenosis, cervical region: Secondary | ICD-10-CM

## 2023-08-01 ENCOUNTER — Ambulatory Visit (INDEPENDENT_AMBULATORY_CARE_PROVIDER_SITE_OTHER): Payer: 59 | Admitting: Physician Assistant

## 2023-08-01 ENCOUNTER — Other Ambulatory Visit: Payer: Self-pay | Admitting: Physician Assistant

## 2023-08-01 ENCOUNTER — Encounter: Payer: Self-pay | Admitting: Physician Assistant

## 2023-08-01 VITALS — BP 122/70 | HR 105 | Temp 97.7°F | Resp 14 | Ht 71.0 in | Wt 189.0 lb

## 2023-08-01 DIAGNOSIS — R37 Sexual dysfunction, unspecified: Secondary | ICD-10-CM | POA: Diagnosis not present

## 2023-08-01 DIAGNOSIS — J0110 Acute frontal sinusitis, unspecified: Secondary | ICD-10-CM

## 2023-08-01 DIAGNOSIS — M4802 Spinal stenosis, cervical region: Secondary | ICD-10-CM

## 2023-08-01 MED ORDER — IBUPROFEN 800 MG PO TABS: 800.0000 mg | ORAL_TABLET | Freq: Three times a day (TID) | ORAL | 1 refills | Status: DC | PRN

## 2023-08-01 MED ORDER — BREZTRI AEROSPHERE 160-9-4.8 MCG/ACT IN AERO: 2.0000 | INHALATION_SPRAY | Freq: Two times a day (BID) | RESPIRATORY_TRACT | 5 refills | Status: DC

## 2023-08-01 MED ORDER — ESOMEPRAZOLE MAGNESIUM 40 MG PO CPDR: 40.0000 mg | DELAYED_RELEASE_CAPSULE | Freq: Every day | ORAL | 3 refills | Status: DC

## 2023-08-01 MED ORDER — LEVALBUTEROL TARTRATE 45 MCG/ACT IN AERO: INHALATION_SPRAY | RESPIRATORY_TRACT | 12 refills | Status: DC

## 2023-08-01 MED ORDER — AMOXICILLIN-POT CLAVULANATE 875-125 MG PO TABS: 1.0000 | ORAL_TABLET | Freq: Two times a day (BID) | ORAL | 0 refills | Status: DC

## 2023-08-01 MED ORDER — ATORVASTATIN CALCIUM 20 MG PO TABS: 20.0000 mg | ORAL_TABLET | Freq: Every day | ORAL | 1 refills | Status: DC

## 2023-08-01 MED ORDER — SILDENAFIL CITRATE 50 MG PO TABS: 50.0000 mg | ORAL_TABLET | Freq: Every day | ORAL | 3 refills | Status: DC | PRN

## 2023-08-01 NOTE — Progress Notes (Signed)
Acute Office Visit  Subjective:    Patient ID: Robert Mcneil, male    DOB: 1970/10/09, 52 y.o.   MRN: [id number]  Chief Complaint  Patient presents with   Sinusitis    HPI: Patient is in today for complaints of frontal sinus pressure and pnd - has had slight cough as well Denies fever - symptoms have been going on for about 3 weeks Using his inhalers as directed --- requests refill of all meds   Current Outpatient Medications:    amoxicillin-clavulanate (AUGMENTIN) 875-125 MG tablet, Take 1 tablet by mouth 2 (two) times daily., Disp: 20 tablet, Rfl: 0   esomeprazole (NEXIUM) 40 MG capsule, Take 1 capsule (40 mg total) by mouth daily., Disp: 30 capsule, Rfl: 3   atorvastatin (LIPITOR) 20 MG tablet, Take 1 tablet (20 mg total) by mouth daily., Disp: 90 tablet, Rfl: 1   Budeson-Glycopyrrol-Formoterol (BREZTRI AEROSPHERE) 160-9-4.8 MCG/ACT AERO, Inhale 2 puffs into the lungs in the morning and at bedtime., Disp: 1 each, Rfl: 5   ibuprofen (ADVIL) 800 MG tablet, Take 1 tablet (800 mg total) by mouth every 8 (eight) hours as needed., Disp: 90 tablet, Rfl: 1   levalbuterol (XOPENEX HFA) 45 MCG/ACT inhaler, INHALE 2 PUFFS INTO THE LUNGS EVERY 6 HOURS AS NEEDED FOR WHEEZE, Disp: 1 each, Rfl: 12   sildenafil (VIAGRA) 50 MG tablet, Take 1 tablet (50 mg total) by mouth daily as needed., Disp: 10 tablet, Rfl: 3  No Known Allergies  ROS CONSTITUTIONAL: Negative for chills, fatigue, fever,  E/N/T: see HPI CARDIOVASCULAR: Negative for chest pain, dizziness, RESPIRATORY: Negative for recent cough and dyspnea.       Objective:    PHYSICAL EXAM:   BP 122/70   Pulse (!) 105   Temp 97.7 F (36.5 C)   Resp 14   Ht 5\' 11"  (1.803 m)   Wt 189 lb (85.7 kg)   SpO2 96%   BMI 26.36 kg/m    GEN: Well nourished, well developed, in no acute distress  HEENT: normal external ears and nose - normal external auditory canals and TMS -- Lips, Teeth and Gums - normal  Oropharynx - mild  erythema/pnd  Cardiac: RRR; no murmurs, rubs, Respiratory:  normal respiratory rate and pattern with no distress - normal breath sounds with no rales, rhonchi, wheezes or rubs      Assessment & Plan:    Acute non-recurrent frontal sinusitis -     Amoxicillin-Pot Clavulanate; Take 1 tablet by mouth 2 (two) times daily.  Dispense: 20 tablet; Refill: 0  Spinal stenosis of cervical region -     Ibuprofen; Take 1 tablet (800 mg total) by mouth every 8 (eight) hours as needed.  Dispense: 90 tablet; Refill: 1  Sexual dysfunction -     Sildenafil Citrate; Take 1 tablet (50 mg total) by mouth daily as needed.  Dispense: 10 tablet; Refill: 3  Other orders -     Atorvastatin Calcium; Take 1 tablet (20 mg total) by mouth daily.  Dispense: 90 tablet; Refill: 1 -     Breztri Aerosphere; Inhale 2 puffs into the lungs in the morning and at bedtime.  Dispense: 1 each; Refill: 5 -     Levalbuterol Tartrate; INHALE 2 PUFFS INTO THE LUNGS EVERY 6 HOURS AS NEEDED FOR WHEEZE  Dispense: 1 each; Refill: 12 -     Esomeprazole Magnesium; Take 1 capsule (40 mg total) by mouth daily.  Dispense: 30 capsule; Refill: 3     Follow-up: Return  if symptoms worsen or fail to improve.  An After Visit Summary was printed and given to the patient.  Jettie Pagan [facility] [phone]

## 2023-08-02 ENCOUNTER — Other Ambulatory Visit: Payer: Self-pay

## 2023-08-02 MED ORDER — OMEPRAZOLE 40 MG PO CPDR: 40.0000 mg | DELAYED_RELEASE_CAPSULE | Freq: Every day | ORAL | 3 refills | Status: DC

## 2023-10-19 ENCOUNTER — Encounter: Payer: 59 | Admitting: Physician Assistant

## 2023-10-29 ENCOUNTER — Ambulatory Visit (INDEPENDENT_AMBULATORY_CARE_PROVIDER_SITE_OTHER): Admitting: Physician Assistant

## 2023-10-29 ENCOUNTER — Encounter: Payer: Self-pay | Admitting: Physician Assistant

## 2023-10-29 VITALS — BP 108/70 | HR 94 | Temp 98.5°F | Resp 18 | Ht 71.0 in | Wt 193.6 lb

## 2023-10-29 DIAGNOSIS — R82998 Other abnormal findings in urine: Secondary | ICD-10-CM | POA: Diagnosis not present

## 2023-10-29 DIAGNOSIS — J069 Acute upper respiratory infection, unspecified: Secondary | ICD-10-CM

## 2023-10-29 DIAGNOSIS — R5381 Other malaise: Secondary | ICD-10-CM | POA: Diagnosis not present

## 2023-10-29 LAB — POCT URINALYSIS DIP (CLINITEK)
Blood, UA: NEGATIVE
Glucose, UA: NEGATIVE mg/dL
Nitrite, UA: NEGATIVE
POC PROTEIN,UA: 30 — AB
Spec Grav, UA: 1.025 (ref 1.010–1.025)
Urobilinogen, UA: 1 U/dL
pH, UA: 6 (ref 5.0–8.0)

## 2023-10-29 LAB — POC COVID19 BINAXNOW: SARS Coronavirus 2 Ag: NEGATIVE

## 2023-10-29 LAB — POCT FLU A/B STATUS
Influenza A, POC: NEGATIVE
Influenza B, POC: NEGATIVE

## 2023-10-29 NOTE — Progress Notes (Signed)
 Acute Office Visit  Subjective:    Patient ID: Robert Mcneil, male    DOB: 03-04-1971, 53 y.o.   MRN: [id number]  Chief Complaint  Patient presents with   Cough   Nasal Congestion   Fatigue    HPI: Patient is in today for complaints of having a stomach bug over the weekend and still having some mild low stomach pain - he had some mild cough and congestion since this weekend as well.  His main complaint today is general malaise. Denies fever, nausea, constipation, vomiting -- still some mild stomach fullness   Current Outpatient Medications:    atorvastatin (LIPITOR) 20 MG tablet, Take 1 tablet (20 mg total) by mouth daily., Disp: 90 tablet, Rfl: 1   Budeson-Glycopyrrol-Formoterol (BREZTRI AEROSPHERE) 160-9-4.8 MCG/ACT AERO, Inhale 2 puffs into the lungs in the morning and at bedtime., Disp: 1 each, Rfl: 5   ibuprofen (ADVIL) 800 MG tablet, Take 1 tablet (800 mg total) by mouth every 8 (eight) hours as needed., Disp: 90 tablet, Rfl: 1   levalbuterol (XOPENEX HFA) 45 MCG/ACT inhaler, INHALE 2 PUFFS INTO THE LUNGS EVERY 6 HOURS AS NEEDED FOR WHEEZE, Disp: 1 each, Rfl: 12   omeprazole (PRILOSEC) 40 MG capsule, Take 1 capsule (40 mg total) by mouth daily., Disp: 30 capsule, Rfl: 3   sildenafil (VIAGRA) 50 MG tablet, Take 1 tablet (50 mg total) by mouth daily as needed., Disp: 10 tablet, Rfl: 3  No Known Allergies  ROS CONSTITUTIONAL: Negative for chills, fatigue, fever,  E/N/T: Negative for ear pain, nasal congestion and sore throat.  CARDIOVASCULAR: Negative for chest pain, dizziness, palpitations and pedal edema.  RESPIRATORY: Negative for recent cough and dyspnea.  GASTROINTESTINAL: see HPI MSK: Negative for arthralgias and myalgias.  INTEGUMENTARY: Negative for rash.  PSYCHIATRIC: Negative for sleep disturbance and to question depression screen.  Negative for depression, negative for anhedonia.      Objective:    PHYSICAL EXAM:   BP 108/70 (BP Location: Left Arm,  Patient Position: Sitting, Cuff Size: Normal)   Pulse 94   Temp 98.5 F (36.9 C) (Temporal)   Resp 18   Ht 5\' 11"  (1.803 m)   Wt 193 lb 9.6 oz (87.8 kg)   SpO2 98%   BMI 27.00 kg/m    GEN: Well nourished, well developed, in no acute distress  HEENT: normal external ears and nose - normal external auditory canals and TMS -- Lips, Teeth and Gums - normal  Oropharynx - normal mucosa, palate, and posterior pharynx Cardiac: RRR; no murmurs,  Respiratory:  normal respiratory rate and pattern with no distress - normal breath sounds with no rales, rhonchi, wheezes or rubs GI: normal bowel sounds, no masses or tenderness  Skin: warm and dry, no rash   Psych: euthymic mood, appropriate affect and demeanor     Office Visit on 10/29/2023  Component Date Value Ref Range Status   Influenza A, POC 10/29/2023 Negative  Negative Final   Influenza B, POC 10/29/2023 Negative  Negative Final   SARS Coronavirus 2 Ag 10/29/2023 Negative  Negative Final   Color, UA 10/29/2023 straw (A)  yellow Final   Clarity, UA 10/29/2023 cloudy (A)  clear Final   Glucose, UA 10/29/2023 negative  negative mg/dL Final   Bilirubin, UA 78/29/5621 small (A)  negative Final   Ketones, POC UA 10/29/2023 trace (5) (A)  negative mg/dL Final   Spec Grav, UA 30/86/5784 1.025  1.010 - 1.025 Final   Blood, UA 10/29/2023 negative  negative Final   pH, UA 10/29/2023 6.0  5.0 - 8.0 Final   POC PROTEIN,UA 10/29/2023 =30 (A)  negative, trace Final   Urobilinogen, UA 10/29/2023 1.0  0.2 or 1.0 E.U./dL Final   Nitrite, UA 60/45/4098 Negative  Negative Final   Leukocytes, UA 10/29/2023 Trace (A)  Negative Final    Assessment & Plan:    Acute upper respiratory infection -     POCT Flu A & B Status -     POC COVID-19 BinaxNow Otc decongestants as needed Malaise -     POCT URINALYSIS DIP (CLINITEK) -     CBC with Differential/Platelet -     Comprehensive metabolic panel -     TSH Rest , fluids Leukocytes in urine -      Urine Culture Will treat according to results    Follow-up: Return if symptoms worsen or fail to improve.  An After Visit Summary was printed and given to the patient.  Jettie Pagan [facility] [phone]

## 2023-10-30 ENCOUNTER — Other Ambulatory Visit: Payer: Self-pay | Admitting: Physician Assistant

## 2023-10-30 ENCOUNTER — Telehealth: Payer: Self-pay

## 2023-10-30 ENCOUNTER — Encounter: Payer: Self-pay | Admitting: Physician Assistant

## 2023-10-30 DIAGNOSIS — R899 Unspecified abnormal finding in specimens from other organs, systems and tissues: Secondary | ICD-10-CM

## 2023-10-30 LAB — CBC WITH DIFFERENTIAL/PLATELET
Basophils Absolute: 0 10*3/uL (ref 0.0–0.2)
Basos: 1 %
EOS (ABSOLUTE): 0.2 10*3/uL (ref 0.0–0.4)
Eos: 3 %
Hematocrit: 41 % (ref 37.5–51.0)
Hemoglobin: 14.2 g/dL (ref 13.0–17.7)
Immature Grans (Abs): 0 10*3/uL (ref 0.0–0.1)
Immature Granulocytes: 1 %
Lymphocytes Absolute: 1.2 10*3/uL (ref 0.7–3.1)
Lymphs: 18 %
MCH: 31.6 pg (ref 26.6–33.0)
MCHC: 34.6 g/dL (ref 31.5–35.7)
MCV: 91 fL (ref 79–97)
Monocytes Absolute: 1.1 10*3/uL — ABNORMAL HIGH (ref 0.1–0.9)
Monocytes: 17 %
Neutrophils Absolute: 4 10*3/uL (ref 1.4–7.0)
Neutrophils: 60 %
Platelets: 232 10*3/uL (ref 150–450)
RBC: 4.49 x10E6/uL (ref 4.14–5.80)
RDW: 12.5 % (ref 11.6–15.4)
WBC: 6.5 10*3/uL (ref 3.4–10.8)

## 2023-10-30 LAB — COMPREHENSIVE METABOLIC PANEL
ALT: 50 IU/L — ABNORMAL HIGH (ref 0–44)
AST: 26 IU/L (ref 0–40)
Albumin: 4.2 g/dL (ref 3.8–4.9)
Alkaline Phosphatase: 79 IU/L (ref 44–121)
BUN/Creatinine Ratio: 22 — ABNORMAL HIGH (ref 9–20)
BUN: 19 mg/dL (ref 6–24)
Bilirubin Total: 0.3 mg/dL (ref 0.0–1.2)
CO2: 23 mmol/L (ref 20–29)
Calcium: 8.3 mg/dL — ABNORMAL LOW (ref 8.7–10.2)
Chloride: 105 mmol/L (ref 96–106)
Creatinine, Ser: 0.87 mg/dL (ref 0.76–1.27)
Globulin, Total: 1.9 g/dL (ref 1.5–4.5)
Glucose: 89 mg/dL (ref 70–99)
Potassium: 3.8 mmol/L (ref 3.5–5.2)
Sodium: 140 mmol/L (ref 134–144)
Total Protein: 6.1 g/dL (ref 6.0–8.5)
eGFR: 103 mL/min/{1.73_m2} (ref >59)

## 2023-10-30 LAB — TSH: TSH: 2.77 u[IU]/mL (ref 0.450–4.500)

## 2023-10-30 NOTE — Telephone Encounter (Signed)
 Spoke to patient wife about labs, and schedule appointment for lab   Copied from CRM [id number]. Topic: Clinical - Lab/Test Results >> Oct 30, 2023  3:19 PM Gery Pray wrote: Reason for CRM: Patient's wife, Aram Beecham, called in regards to results from blood work. Please contact patient at [phone].

## 2023-10-31 LAB — URINE CULTURE

## 2023-11-04 ENCOUNTER — Other Ambulatory Visit: Payer: Self-pay | Admitting: Physician Assistant

## 2023-11-04 DIAGNOSIS — M4802 Spinal stenosis, cervical region: Secondary | ICD-10-CM

## 2023-11-09 ENCOUNTER — Other Ambulatory Visit

## 2023-11-09 DIAGNOSIS — R899 Unspecified abnormal finding in specimens from other organs, systems and tissues: Secondary | ICD-10-CM

## 2023-11-09 LAB — COMPREHENSIVE METABOLIC PANEL WITH GFR
ALT: 38 IU/L (ref 0–44)
AST: 24 IU/L (ref 0–40)
Albumin: 4.2 g/dL (ref 3.8–4.9)
Alkaline Phosphatase: 74 IU/L (ref 44–121)
BUN/Creatinine Ratio: 14 (ref 9–20)
BUN: 13 mg/dL (ref 6–24)
Bilirubin Total: <0.2 mg/dL (ref 0.0–1.2)
CO2: 20 mmol/L (ref 20–29)
Calcium: 9.1 mg/dL (ref 8.7–10.2)
Chloride: 105 mmol/L (ref 96–106)
Creatinine, Ser: 0.94 mg/dL (ref 0.76–1.27)
Globulin, Total: 2.2 g/dL (ref 1.5–4.5)
Glucose: 107 mg/dL — ABNORMAL HIGH (ref 70–99)
Potassium: 4.4 mmol/L (ref 3.5–5.2)
Sodium: 140 mmol/L (ref 134–144)
Total Protein: 6.4 g/dL (ref 6.0–8.5)
eGFR: 96 mL/min/{1.73_m2} (ref >59)

## 2023-11-12 ENCOUNTER — Encounter: Payer: Self-pay | Admitting: Physician Assistant

## 2023-11-16 ENCOUNTER — Ambulatory Visit: Admitting: Physician Assistant

## 2023-11-16 ENCOUNTER — Encounter: Payer: Self-pay | Admitting: Physician Assistant

## 2023-11-16 VITALS — BP 110/70 | HR 75 | Temp 98.0°F | Resp 18 | Ht 70.0 in | Wt 192.2 lb

## 2023-11-16 DIAGNOSIS — Z0000 Encounter for general adult medical examination without abnormal findings: Secondary | ICD-10-CM

## 2023-11-16 DIAGNOSIS — R82998 Other abnormal findings in urine: Secondary | ICD-10-CM

## 2023-11-16 DIAGNOSIS — R37 Sexual dysfunction, unspecified: Secondary | ICD-10-CM | POA: Diagnosis not present

## 2023-11-16 LAB — POCT URINALYSIS DIP (CLINITEK)
Bilirubin, UA: NEGATIVE
Blood, UA: NEGATIVE
Glucose, UA: NEGATIVE mg/dL
Ketones, POC UA: NEGATIVE mg/dL
Nitrite, UA: NEGATIVE
POC PROTEIN,UA: NEGATIVE
Spec Grav, UA: <=1.005 — AB (ref 1.010–1.025)
Urobilinogen, UA: 0.2 U/dL
pH, UA: 7 (ref 5.0–8.0)

## 2023-11-16 MED ORDER — SILDENAFIL CITRATE 50 MG PO TABS: 50.0000 mg | ORAL_TABLET | Freq: Every day | ORAL | 5 refills | Status: DC | PRN

## 2023-11-16 MED ORDER — DOXYCYCLINE HYCLATE 100 MG PO TABS: 100.0000 mg | ORAL_TABLET | Freq: Two times a day (BID) | ORAL | 0 refills | Status: DC

## 2023-11-16 MED ORDER — BREZTRI AEROSPHERE 160-9-4.8 MCG/ACT IN AERO: 2.0000 | INHALATION_SPRAY | Freq: Two times a day (BID) | RESPIRATORY_TRACT | 5 refills | Status: DC

## 2023-11-16 MED ORDER — LEVALBUTEROL TARTRATE 45 MCG/ACT IN AERO: INHALATION_SPRAY | RESPIRATORY_TRACT | 5 refills | Status: DC

## 2023-11-16 MED ORDER — ATORVASTATIN CALCIUM 20 MG PO TABS: 20.0000 mg | ORAL_TABLET | Freq: Every day | ORAL | 1 refills | Status: DC

## 2023-11-16 MED ORDER — OMEPRAZOLE 40 MG PO CPDR: 40.0000 mg | DELAYED_RELEASE_CAPSULE | Freq: Every day | ORAL | 1 refills | Status: DC

## 2023-11-16 NOTE — Progress Notes (Signed)
 Subjective:  Patient ID: Robert Mcneil, male    DOB: 04/10/1971  Age: 53 y.o. MRN: [id number]  Chief Complaint  Patient presents with   Annual Exam    HPI Well Adult Physical: Patient here for a comprehensive physical exam.The patient reports no problems Do you take any herbs or supplements that were not prescribed by a doctor? no Are you taking calcium supplements? no Are you taking aspirin daily? no  Encounter for general adult medical examination without abnormal findings  Physical ("At Risk" items are starred): Patient's last physical exam was 1 year ago .  Patient is not afflicted from Stress Incontinence and Urge Incontinence  Patient wears a seat belt, has smoke detectors, has carbon monoxide detectors, practices appropriate gun safety, and wears sunscreen with extended sun exposure. Dental Care: brushes and flosses daily. Last dental visit: is due Vision impairments: none Ophthalmology/Optometry: is due Hearing loss: none Last PSA: is due Lab Results  Component Value Date   PSA1 0.4 09/05/2022   PSA1 0.3 08/19/2021   Self Testicular Exam: Yes     11/16/2023   10:24 AM 04/13/2023   10:05 AM 09/05/2022    3:11 PM 08/19/2021   10:33 AM 08/11/2020    3:32 PM  Depression screen PHQ 2/9  Decreased Interest 0 0 0 0 0  Down, Depressed, Hopeless 0 0 0 0 0  PHQ - 2 Score 0 0 0 0 0         08/19/2021   10:33 AM 09/05/2022    3:13 PM 04/13/2023   10:05 AM 11/16/2023   10:26 AM  Fall Risk  Falls in the past year? 0 0 0 0  Was there an injury with Fall? 0 0 0 0  Fall Risk Category Calculator 0 0 0 0  Fall Risk Category (Retired) Low     (RETIRED) Patient Fall Risk Level Low fall risk     Patient at Risk for Falls Due to  No Fall Risks No Fall Risks No Fall Risks  Fall risk Follow up Falls evaluation completed Falls evaluation completed Falls evaluation completed Falls evaluation completed             Social Hx   Social History   Socioeconomic History   Marital  status: Married    Spouse name: Not on file   Number of children: 2   Years of education: Not on file   Highest education level: GED or equivalent  Occupational History   Occupation: Journalist, newspaper  Tobacco Use   Smoking status: Every Day    Current packs/day: 0.50    Average packs/day: 0.5 packs/day for 25.3 years (12.6 ttl pk-yrs)    Types: Cigarettes    Start date: 2000   Smokeless tobacco: Never  Vaping Use   Vaping status: Never Used  Substance and Sexual Activity   Alcohol use: Not Currently   Drug use: Never   Sexual activity: Yes    Partners: Female  Other Topics Concern   Not on file  Social History Narrative   Not on file   Social Drivers of Health   Financial Resource Strain: Low Risk  (11/16/2023)   Overall Financial Resource Strain (CARDIA)    Difficulty of Paying Living Expenses: Not hard at all  Food Insecurity: No Food Insecurity (11/16/2023)   Hunger Vital Sign    Worried About Running Out of Food in the Last Year: Never true    Ran Out of Food in the Last Year: Never true  Transportation Needs: No Transportation Needs (11/16/2023)   PRAPARE - Administrator, Civil Service (Medical): No    Lack of Transportation (Non-Medical): No  Physical Activity: Sufficiently Active (11/16/2023)   Exercise Vital Sign    Days of Exercise per Week: 5 days    Minutes of Exercise per Session: 30 min  Stress: No Stress Concern Present (11/16/2023)   [employer] of Occupational Health - Occupational Stress Questionnaire    Feeling of Stress : Not at all  Social Connections: Moderately Integrated (11/16/2023)   Social Connection and Isolation Panel [NHANES]    Frequency of Communication with Friends and Family: More than three times a week    Frequency of Social Gatherings with Friends and Family: Three times a week    Attends Religious Services: 1 to 4 times per year    Active Member of Clubs or Organizations: No    Attends Banker Meetings: Never     Marital Status: Married   Past Medical History:  Diagnosis Date   Anxiety    Depression    Past Surgical History:  Procedure Laterality Date   CERVICAL DISC ARTHROPLASTY  02/2020   KNEE SURGERY Left 2018    Family History  Problem Relation Age of Onset   Breast cancer Paternal Grandmother    Diabetes Mother    Alzheimer's disease Father     ROS CONSTITUTIONAL: Negative for chills, fatigue, fever, unintentional weight gain and unintentional weight loss.  E/N/T: Negative for ear pain, nasal congestion and sore throat.  CARDIOVASCULAR: Negative for chest pain, dizziness, palpitations and pedal edema.  RESPIRATORY: Negative for recent cough and dyspnea.  GASTROINTESTINAL: Negative for abdominal pain, acid reflux symptoms, constipation, diarrhea, nausea and vomiting.  MSK: Negative for arthralgias and myalgias.  INTEGUMENTARY: Negative for rash.  NEUROLOGICAL: Negative for dizziness and headaches.  PSYCHIATRIC: Negative for sleep disturbance and to question depression screen.  Negative for depression, negative for anhedonia.   Objective:  PHYSICAL EXAM:   BP 110/70   Pulse 75   Temp 98 F (36.7 C) (Temporal)   Resp 18   Ht 5\' 10"  (1.778 m)   Wt 192 lb 3.2 oz (87.2 kg)   SpO2 98%   BMI 27.58 kg/m  BP Readings from Last 3 Encounters:  11/16/23 110/70  10/29/23 108/70  08/01/23 122/70     Vision Screening   Right eye Left eye Both eyes  Without correction 20/20 20/20 20/20   With correction       GEN: Well nourished, well developed, in no acute distress  HEENT: normal external ears and nose - normal external auditory canals and TMS - hearing grossly normal - - Lips, Teeth and Gums - normal  Oropharynx - normal mucosa, palate, and posterior pharynx Neck: no JVD or masses - no thyromegaly Cardiac: RRR; no murmurs, rubs, or gallops,no edema - no significant varicosities Respiratory:  normal respiratory rate and pattern with no distress - normal breath sounds with no  rales, rhonchi, wheezes or rubs GI: normal bowel sounds, no masses or tenderness MS: no deformity or atrophy  Skin: warm and dry, no rash  Neuro:  Alert and Oriented x 3, Strength and sensation are intact - CN II-Xii grossly intact Psych: euthymic mood, appropriate affect and demeanor  Office Visit on 11/16/2023  Component Date Value Ref Range Status   Color, UA 11/16/2023 yellow  yellow Final   Clarity, UA 11/16/2023 cloudy (A)  clear Final   Glucose, UA 11/16/2023 negative  negative  mg/dL Final   Bilirubin, UA 16/05/9603 negative  negative Final   Ketones, POC UA 11/16/2023 negative  negative mg/dL Final   Spec Grav, UA 54/04/8118 <=1.005 (A)  1.010 - 1.025 Final   Blood, UA 11/16/2023 negative  negative Final   pH, UA 11/16/2023 7.0  5.0 - 8.0 Final   POC PROTEIN,UA 11/16/2023 negative  negative, trace Final   Urobilinogen, UA 11/16/2023 0.2  0.2 or 1.0 E.U./dL Final   Nitrite, UA 14/78/2956 Negative  Negative Final   Leukocytes, UA 11/16/2023 Small (1+) (A)  Negative Final    Assessment & Plan:  Routine physical examination -     CBC with Differential/Platelet -     Comprehensive metabolic panel with GFR -     Lipid panel -     Hemoglobin A1c -     PSA  Leukocytes in urine -     POCT URINALYSIS DIP (CLINITEK) -     Doxycycline Hyclate; Take 1 tablet (100 mg total) by mouth 2 (two) times daily.  Dispense: 20 tablet; Refill: 0 -     Urine Culture  Sexual dysfunction -     Sildenafil Citrate; Take 1 tablet (50 mg total) by mouth daily as needed.  Dispense: 10 tablet; Refill: 5  Other orders -     Atorvastatin Calcium; Take 1 tablet (20 mg total) by mouth daily.  Dispense: 90 tablet; Refill: 1 -     Breztri Aerosphere; Inhale 2 puffs into the lungs in the morning and at bedtime.  Dispense: 1 each; Refill: 5 -     Levalbuterol Tartrate; INHALE 2 PUFFS INTO THE LUNGS EVERY 6 HOURS AS NEEDED FOR WHEEZE  Dispense: 1 each; Refill: 5 -     Omeprazole; Take 1 capsule (40 mg total)  by mouth daily.  Dispense: 90 capsule; Refill: 1    This is a list of the screening recommended for you and due dates:  Health Maintenance  Topic Date Due   Pneumococcal Vaccination (1 of 2 - PCV) 11/15/2024*   Flu Shot  03/14/2024   DTaP/Tdap/Td vaccine (2 - Td or Tdap) 01/25/2030   Colon Cancer Screening  10/07/2030   HPV Vaccine  Aged Out   COVID-19 Vaccine  Discontinued   Hepatitis C Screening  Discontinued   HIV Screening  Discontinued   Zoster (Shingles) Vaccine  Discontinued  *Topic was postponed. The date shown is not the original due date.     Follow-up: Return in about 6 months (around 05/17/2024) for chronic fasting follow-up.  An After Visit Summary was printed and given to the patient.  Jettie Pagan [facility] [phone]

## 2023-11-17 LAB — COMPREHENSIVE METABOLIC PANEL WITH GFR
ALT: 39 IU/L (ref 0–44)
AST: 22 IU/L (ref 0–40)
Albumin: 4.2 g/dL (ref 3.8–4.9)
Alkaline Phosphatase: 76 IU/L (ref 44–121)
BUN/Creatinine Ratio: 18 (ref 9–20)
BUN: 16 mg/dL (ref 6–24)
Bilirubin Total: 0.4 mg/dL (ref 0.0–1.2)
CO2: 21 mmol/L (ref 20–29)
Calcium: 9.1 mg/dL (ref 8.7–10.2)
Chloride: 103 mmol/L (ref 96–106)
Creatinine, Ser: 0.89 mg/dL (ref 0.76–1.27)
Globulin, Total: 2.2 g/dL (ref 1.5–4.5)
Glucose: 93 mg/dL (ref 70–99)
Potassium: 4.4 mmol/L (ref 3.5–5.2)
Sodium: 138 mmol/L (ref 134–144)
Total Protein: 6.4 g/dL (ref 6.0–8.5)
eGFR: 102 mL/min/{1.73_m2} (ref >59)

## 2023-11-17 LAB — CBC WITH DIFFERENTIAL/PLATELET
Basophils Absolute: 0.1 10*3/uL (ref 0.0–0.2)
Basos: 1 %
EOS (ABSOLUTE): 0.2 10*3/uL (ref 0.0–0.4)
Eos: 4 %
Hematocrit: 41.7 % (ref 37.5–51.0)
Hemoglobin: 13.9 g/dL (ref 13.0–17.7)
Immature Grans (Abs): 0 10*3/uL (ref 0.0–0.1)
Immature Granulocytes: 0 %
Lymphocytes Absolute: 1.6 10*3/uL (ref 0.7–3.1)
Lymphs: 24 %
MCH: 30.7 pg (ref 26.6–33.0)
MCHC: 33.3 g/dL (ref 31.5–35.7)
MCV: 92 fL (ref 79–97)
Monocytes Absolute: 0.8 10*3/uL (ref 0.1–0.9)
Monocytes: 13 %
Neutrophils Absolute: 3.7 10*3/uL (ref 1.4–7.0)
Neutrophils: 58 %
Platelets: 278 10*3/uL (ref 150–450)
RBC: 4.53 x10E6/uL (ref 4.14–5.80)
RDW: 12.7 % (ref 11.6–15.4)
WBC: 6.4 10*3/uL (ref 3.4–10.8)

## 2023-11-17 LAB — LIPID PANEL
Chol/HDL Ratio: 4.8 ratio (ref 0.0–5.0)
Cholesterol, Total: 176 mg/dL (ref 100–199)
HDL: 37 mg/dL — ABNORMAL LOW (ref >39)
LDL Chol Calc (NIH): 126 mg/dL — ABNORMAL HIGH (ref 0–99)
Triglycerides: 66 mg/dL (ref 0–149)
VLDL Cholesterol Cal: 13 mg/dL (ref 5–40)

## 2023-11-17 LAB — HEMOGLOBIN A1C
Est. average glucose Bld gHb Est-mCnc: 123 mg/dL
Hgb A1c MFr Bld: 5.9 % — ABNORMAL HIGH (ref 4.8–5.6)

## 2023-11-17 LAB — PSA: Prostate Specific Ag, Serum: 0.3 ng/mL (ref 0.0–4.0)

## 2023-11-19 ENCOUNTER — Encounter: Payer: Self-pay | Admitting: Physician Assistant

## 2023-11-20 LAB — URINE CULTURE

## 2024-02-02 ENCOUNTER — Ambulatory Visit (HOSPITAL_BASED_OUTPATIENT_CLINIC_OR_DEPARTMENT_OTHER)
Admission: RE | Admit: 2024-02-02 | Discharge: 2024-02-02 | Disposition: A | Discharge status: Home / Self Care | Source: Ambulatory Visit | Attending: Family Medicine | Admitting: Family Medicine

## 2024-02-02 ENCOUNTER — Encounter (HOSPITAL_BASED_OUTPATIENT_CLINIC_OR_DEPARTMENT_OTHER): Payer: Self-pay

## 2024-02-02 VITALS — BP 111/66 | HR 68 | Temp 98.4°F | Resp 20

## 2024-02-02 DIAGNOSIS — L237 Allergic contact dermatitis due to plants, except food: Secondary | ICD-10-CM

## 2024-02-02 MED ORDER — PREDNISONE 10 MG (21) PO TBPK
ORAL_TABLET | Freq: Every day | ORAL | 0 refills | Status: DC
Start: 2024-02-02 — End: 2024-03-04

## 2024-02-02 NOTE — ED Provider Notes (Signed)
 [facility]    CSN: [id number] Arrival date & time: 02/02/24  1150      History   Chief Complaint Chief Complaint  Patient presents with   Poison Ivy    Entered by patient    HPI Robert Mcneil is a 53 y.o. male.   Patient is a 53 year old male presents today with poison ivy dermatitis.  Reports the rash started last week and has significantly progressed and is now located to bilateral upper and lower extremities and he has got some swelling in the bilateral hands.  He has been taking antihistamine.  The rash is very itchy and irritating.  No fevers, chills   Poison Ivy    Past Medical History:  Diagnosis Date   Anxiety    Depression     Patient Active Problem List   Diagnosis Date Noted   Annual physical exam 09/05/2022   Screen for colon cancer 08/11/2020   Skin lesion 07/15/2020   Epigastric pain 04/21/2020   Acute laryngopharyngitis 02/12/2020   Mixed hyperlipidemia 01/26/2020   Anxiety 01/26/2020   Need for tetanus, diphtheria, and acellular pertussis (Tdap) vaccine in patient of adolescent age or older 01/26/2020   Hx of migraines 01/26/2020   Spinal stenosis of cervical region 10/22/2019    Past Surgical History:  Procedure Laterality Date   CERVICAL DISC ARTHROPLASTY  02/2020   KNEE SURGERY Left 2018       Home Medications    Prior to Admission medications   Medication Sig Start Date End Date Taking? Authorizing Provider  predniSONE (STERAPRED UNI-PAK 21 TAB) 10 MG (21) TBPK tablet Take by mouth daily. Take 6 tabs by mouth daily  for 2 days, then 5 tabs for 2 days, then 4 tabs for 2 days, then 3 tabs for 2 days, 2 tabs for 2 days, then 1 tab by mouth daily for 2 days 02/02/24  Yes Steve Gregg A, FNP  atorvastatin  (LIPITOR) 20 MG tablet Take 1 tablet (20 mg total) by mouth daily. 11/16/23   Nicholaus Credit, PA-C  budeson-glycopyrrolate-formoterol (BREZTRI  AEROSPHERE) 160-9-4.8 MCG/ACT AERO Inhale 2 puffs into the lungs in the morning and at  bedtime. 11/16/23   Nicholaus Credit, PA-C  doxycycline  (VIBRA -TABS) 100 MG tablet Take 1 tablet (100 mg total) by mouth 2 (two) times daily. 11/16/23   Nicholaus Credit, PA-C  ibuprofen  (ADVIL ) 800 MG tablet TAKE 1 TABLET BY MOUTH EVERY 8 HOURS AS NEEDED 11/05/23   Nicholaus Credit, PA-C  levalbuterol  (XOPENEX  HFA) 45 MCG/ACT inhaler INHALE 2 PUFFS INTO THE LUNGS EVERY 6 HOURS AS NEEDED FOR WHEEZE 11/16/23   Nicholaus Credit, PA-C  omeprazole  (PRILOSEC) 40 MG capsule Take 1 capsule (40 mg total) by mouth daily. 11/16/23   Nicholaus Credit, PA-C  sildenafil  (VIAGRA ) 50 MG tablet Take 1 tablet (50 mg total) by mouth daily as needed. 11/16/23   Nicholaus Credit, PA-C    Family History Family History  Problem Relation Age of Onset   Breast cancer Paternal Grandmother    Diabetes Mother    Alzheimer's disease Father     Social History Social History   Tobacco Use   Smoking status: Every Day    Current packs/day: 0.50    Average packs/day: 0.5 packs/day for 25.5 years (12.7 ttl pk-yrs)    Types: Cigarettes    Start date: 2000   Smokeless tobacco: Never  Vaping Use   Vaping status: Never Used  Substance Use Topics   Alcohol use: Not Currently   Drug use: Never  Allergies   Patient has no known allergies.   Review of Systems Review of Systems See HPI  Physical Exam Triage Vital Signs ED Triage Vitals  Encounter Vitals Group     BP 02/02/24 1201 111/66     Girls Systolic BP Percentile --      Girls Diastolic BP Percentile --      Boys Systolic BP Percentile --      Boys Diastolic BP Percentile --      Pulse Rate 02/02/24 1201 68     Resp 02/02/24 1201 20     Temp 02/02/24 1201 98.4 F (36.9 C)     Temp Source 02/02/24 1201 Oral     SpO2 02/02/24 1201 95 %     Weight --      Height --      Head Circumference --      Peak Flow --      Pain Score 02/02/24 1203 0     Pain Loc --      Pain Education --      Exclude from Growth Chart --    No data found.  Updated Vital Signs BP 111/66 (BP Location:  Right Arm)   Pulse 68   Temp 98.4 F (36.9 C) (Oral)   Resp 20   SpO2 95%   Visual Acuity Right Eye Distance:   Left Eye Distance:   Bilateral Distance:    Right Eye Near:   Left Eye Near:    Bilateral Near:     Physical Exam Constitutional:      Appearance: Normal appearance.  Pulmonary:     Effort: Pulmonary effort is normal.   Musculoskeletal:        General: Normal range of motion.   Skin:    Findings: Rash present.     Comments: Erythematous papular rash widespread located to upper and lower extremities.  Significant bilateral hand swelling   Neurological:     Mental Status: He is alert.   Psychiatric:        Mood and Affect: Mood normal.      UC Treatments / Results  Labs (all labs ordered are listed, but only abnormal results are displayed) Labs Reviewed - No data to display  EKG   Radiology No results found.  Procedures Procedures (including critical care time)  Medications Ordered in UC Medications - No data to display  Initial Impression / Assessment and Plan / UC Course  I have reviewed the triage vital signs and the nursing notes.  Pertinent labs & imaging results that were available during my care of the patient were reviewed by me and considered in my medical decision making (see chart for details).     Poison ivy dermatitis-treating with 2-week prednisone taper.  Recommended take this with food.  Can continue with antihistamines. Follow-up as needed Final Clinical Impressions(s) / UC Diagnoses   Final diagnoses:  Poison ivy dermatitis     Discharge Instructions      Take the prednisone pack as prescribed.  Take this with food.  You can do antihistamines over-the-counter.  Follow-up as needed    ED Prescriptions     Medication Sig Dispense Auth. Provider   predniSONE (STERAPRED UNI-PAK 21 TAB) 10 MG (21) TBPK tablet Take by mouth daily. Take 6 tabs by mouth daily  for 2 days, then 5 tabs for 2 days, then 4 tabs for 2 days,  then 3 tabs for 2 days, 2 tabs for 2 days, then 1 tab  by mouth daily for 2 days 42 tablet Zekiah Caruth A, FNP      PDMP not reviewed this encounter.   Adah Wilbert LABOR, FNP 02/02/24 1258

## 2024-02-02 NOTE — ED Triage Notes (Signed)
 Contact with poison ivy last week. All extremities affected including hands. States rash keeps getting worse.

## 2024-02-02 NOTE — Discharge Instructions (Signed)
 Take the prednisone pack as prescribed.  Take this with food.  You can do antihistamines over-the-counter.  Follow-up as needed

## 2024-02-04 ENCOUNTER — Ambulatory Visit: Payer: Self-pay

## 2024-02-04 NOTE — Telephone Encounter (Signed)
 FYI Only or Action Required?: Action required by provider: clinical question for provider.  Patient was last seen in primary care on 11/16/2023 by Nicholaus Credit, PA-C. Called Nurse Triage reporting Poison Ivy. Symptoms began a week ago. Interventions attempted: Prescription medications: Prednisone. Symptoms are: unchanged.  Triage Disposition: Home Care  Patient/caregiver understands and will follow disposition?: Yes  Copied from CRM [id number]. Topic: Clinical - Red Word Triage >> Feb 04, 2024  2:32 PM Elle L wrote: Red Word that prompted transfer to Nurse Triage: The patient has poison ivy. He went to Urgent Care and was given an antibiotic. However, he is concerned that it is going to turn into a staph infection as he has had one in the past and he has sore open wounds. Reason for Disposition  Mild rash from poison ivy, oak or sumac  Answer Assessment - Initial Assessment Questions Pt asking if he needs preventative course of antibiotics after his poison ivy exposure, he was an UC Saturday and was started on Prednisone which has helped with the itching but pt still concerned about his boils from the poison ivy. Nurse explained PCP does not necessarily prescribe antibiotic after poison ivy, unless there was s/s of an infection present.  Pt assessed for s/s of infection, no fever, no n/v, no discharge from boils present. Pt states he does not notice s/s of infection or think he has any s/s of infection present. He was concerned if he should be on any preventative Abx just in case.  Upon bringing up available appmt's nurse explained first availability with his PCP Nicholaus is not until July 7th, but there is availability in the same office with a provider that works alongside PCP [employer] for today and tomorrow. Pt stated he needs to talk with his wife and he will call back.   1. APPEARANCE of RASH: Describe the rash.      Rash and now small boils  2. LOCATION: Where is the rash located?  (e.g., face,  genitals, hands, legs)     Ivy everywhere: back, legs, shoulders, arms, chest 3. SIZE: How large is the rash?      All over body 4. ONSET: When did the rash begin?      Last week 5. ITCHING: Does the rash itch? If Yes, ask: How bad is it?   - MILD - doesn't interfere with normal activities   - MODERATE-SEVERE: interferes with work, school, sleep, or other activities      Itch not as bad, bc on prednisone for 3 days 6. EXPOSURE:  How were you exposed to the plant (poison ivy, poison oak, sumac)  When were you exposed?       oak 7. PAST HISTORY: Have you had a poison ivy rash before? If Yes, ask: How bad was it?     First time  Protocols used: Poison Ivy - Oak - [facility]

## 2024-02-04 NOTE — Telephone Encounter (Signed)
 Agree. Needs appt. Dr. Sherre

## 2024-02-10 ENCOUNTER — Other Ambulatory Visit: Payer: Self-pay | Admitting: Physician Assistant

## 2024-02-10 DIAGNOSIS — M4802 Spinal stenosis, cervical region: Secondary | ICD-10-CM

## 2024-03-04 ENCOUNTER — Ambulatory Visit (INDEPENDENT_AMBULATORY_CARE_PROVIDER_SITE_OTHER): Admitting: Physician Assistant

## 2024-03-04 ENCOUNTER — Ambulatory Visit: Payer: Self-pay

## 2024-03-04 ENCOUNTER — Encounter: Payer: Self-pay | Admitting: Physician Assistant

## 2024-03-04 VITALS — BP 132/87 | HR 72 | Temp 97.2°F | Ht 70.0 in | Wt 175.0 lb

## 2024-03-04 DIAGNOSIS — L247 Irritant contact dermatitis due to plants, except food: Secondary | ICD-10-CM | POA: Insufficient documentation

## 2024-03-04 DIAGNOSIS — K047 Periapical abscess without sinus: Secondary | ICD-10-CM | POA: Insufficient documentation

## 2024-03-04 MED ORDER — CEPHALEXIN 500 MG PO CAPS: 500.0000 mg | ORAL_CAPSULE | Freq: Four times a day (QID) | ORAL | 0 refills | Status: DC

## 2024-03-04 MED ORDER — TRIAMCINOLONE ACETONIDE 40 MG/ML IJ SUSP
60.0000 mg | Freq: Once | INTRAMUSCULAR | Status: AC
  Administered 2024-03-04: 60 mg via INTRAMUSCULAR

## 2024-03-04 NOTE — Progress Notes (Signed)
   Acute Office Visit  Subjective:    Patient ID: Robert Mcneil, male    DOB: 06/19/1971, 53 y.o.   MRN: [id number]  Chief Complaint  Patient presents with   Swelled left cheek    HPI: Patient is in today for complaints of swollen left cheek - does have a broken tooth and will be seeing dentist to have pulled - he was on amoxicillin  and has finished that  Pt also has poison oak on left lower leg and inflamed bites on left upper leg   Current Outpatient Medications:    atorvastatin  (LIPITOR) 20 MG tablet, Take 1 tablet (20 mg total) by mouth daily., Disp: 90 tablet, Rfl: 1   budeson-glycopyrrolate-formoterol (BREZTRI  AEROSPHERE) 160-9-4.8 MCG/ACT AERO, Inhale 2 puffs into the lungs in the morning and at bedtime., Disp: 1 each, Rfl: 5   cephALEXin  (KEFLEX ) 500 MG capsule, Take 1 capsule (500 mg total) by mouth 4 (four) times daily., Disp: 28 capsule, Rfl: 0   ibuprofen  (ADVIL ) 800 MG tablet, TAKE 1 TABLET BY MOUTH EVERY 8 HOURS AS NEEDED, Disp: 90 tablet, Rfl: 1   levalbuterol  (XOPENEX  HFA) 45 MCG/ACT inhaler, INHALE 2 PUFFS INTO THE LUNGS EVERY 6 HOURS AS NEEDED FOR WHEEZE, Disp: 1 each, Rfl: 5   omeprazole  (PRILOSEC) 40 MG capsule, Take 1 capsule (40 mg total) by mouth daily., Disp: 90 capsule, Rfl: 1   sildenafil  (VIAGRA ) 50 MG tablet, Take 1 tablet (50 mg total) by mouth daily as needed., Disp: 10 tablet, Rfl: 5  Current Facility-Administered Medications:    triamcinolone  acetonide (KENALOG -40) injection 60 mg, 60 mg, Intramuscular, Once,   No Known Allergies  ROS CONSTITUTIONAL: Negative for chills, fatigue, fever,  ENT - see HPI CARDIOVASCULAR: Negative for chest pain, dizziness,  RESPIRATORY: Negative for recent cough and dyspnea.   INTEGUMENTARY:see HPI      Objective:    PHYSICAL EXAM:   BP 132/87 (BP Location: Left Arm, Patient Position: Sitting)   Pulse 72   Temp (!) 97.2 F (36.2 C) (Temporal)   Ht 5' 10 (1.778 m)   Wt 175 lb (79.4 kg)   SpO2 98%   BMI  25.11 kg/m    GEN: Well nourished, well developed, in no acute distress  HEENT: broken left molar noted and swelling of left cheek Oropharynx - normal mucosa, palate, and posterior pharynx Cardiac: RRR; no murmurs, Respiratory:  normal respiratory rate and pattern with no distress - normal breath sounds with no rales, rhonchi, wheezes or rubs Skin: linear vesicular lesions left lower leg      Assessment & Plan:    Tooth abscess -     Cephalexin ; Take 1 capsule (500 mg total) by mouth 4 (four) times daily.  Dispense: 28 capsule; Refill: 0  Irritant contact dermatitis due to plants, except food -     Triamcinolone  Acetonide 60mg  IM     Follow-up: Return if symptoms worsen or fail to improve.  An After Visit Summary was printed and given to the patient.  CAMIE JONELLE NICHOLAUS DEVONNA [facility] [phone]

## 2024-03-04 NOTE — Telephone Encounter (Signed)
 FYI Only or Action Required?: FYI only for provider.  Patient was last seen in primary care on 11/16/2023 by Robert Credit, PA-C.  Called Nurse Triage reporting Poison Ivy.  Symptoms began yesterday.  Interventions attempted: Ice/heat application.  Symptoms are: unchanged.  Triage Disposition: See HCP Within 4 Hours (Or PCP Triage)  Patient/caregiver understands and will follow disposition?: Yes      Copied from CRM [id number]. Topic: Clinical - Red Word Triage >> Mar 04, 2024  8:26 AM Avram MATSU wrote: Red Word that prompted transfer to Nurse Triage: facial swelling due to poison oak Reason for Disposition  Face swelling began after taking a drug  Answer Assessment - Initial Assessment Questions 1. ONSET: When did the swelling start? (e.g., minutes, hours, days)     Last night 2. LOCATION: What part of the face is swollen? (e.g., cheek, entire face, jaw joint area, under jaw)     Left cheek  3. SEVERITY: How swollen is it?     Mild-mod 4. ITCHING: Is there any itching? If Yes, ask: How much?   (Scale 1-10; mild, moderate or severe)     5/10 5. PAIN: Is the swelling painful to touch? If Yes, ask: How painful is it?   (Scale 0-10; mild, moderate or severe)     5/10 6. FEVER: Do you have a fever? If Yes, ask: What is it, how was it measured, and when did it start?      no 7. CAUSE: What do you think is causing the face swelling?     Poison oak or dental procedure 8. NEW MEDICINES: Have there been any new medicines started recently?     Amoxicillin  a week ago  9. RECURRENT SYMPTOM: Have you had face swelling before? If Yes, ask: When was the last time? What happened that time?     no 10. OTHER SYMPTOMS: Do you have any other symptoms? (e.g., leg swelling, toothache)       Itching  Protocols used: Face Swelling-A-AH

## 2024-04-25 ENCOUNTER — Other Ambulatory Visit: Payer: Self-pay | Admitting: Physician Assistant

## 2024-04-25 DIAGNOSIS — M4802 Spinal stenosis, cervical region: Secondary | ICD-10-CM

## 2024-05-23 ENCOUNTER — Ambulatory Visit: Admitting: Physician Assistant

## 2024-05-23 ENCOUNTER — Encounter: Payer: Self-pay | Admitting: Physician Assistant

## 2024-05-23 ENCOUNTER — Other Ambulatory Visit: Payer: Self-pay | Admitting: Physician Assistant

## 2024-05-23 VITALS — BP 110/70 | HR 81 | Temp 98.3°F | Resp 18 | Ht 70.0 in | Wt 174.0 lb

## 2024-05-23 DIAGNOSIS — K219 Gastro-esophageal reflux disease without esophagitis: Secondary | ICD-10-CM

## 2024-05-23 DIAGNOSIS — Z23 Encounter for immunization: Secondary | ICD-10-CM | POA: Diagnosis not present

## 2024-05-23 DIAGNOSIS — R37 Sexual dysfunction, unspecified: Secondary | ICD-10-CM

## 2024-05-23 DIAGNOSIS — R3589 Other polyuria: Secondary | ICD-10-CM | POA: Diagnosis not present

## 2024-05-23 DIAGNOSIS — J9801 Acute bronchospasm: Secondary | ICD-10-CM

## 2024-05-23 DIAGNOSIS — R7303 Prediabetes: Secondary | ICD-10-CM

## 2024-05-23 DIAGNOSIS — Z720 Tobacco use: Secondary | ICD-10-CM

## 2024-05-23 DIAGNOSIS — M25552 Pain in left hip: Secondary | ICD-10-CM

## 2024-05-23 DIAGNOSIS — E782 Mixed hyperlipidemia: Secondary | ICD-10-CM | POA: Diagnosis not present

## 2024-05-23 LAB — POCT URINALYSIS DIP (CLINITEK)
Bilirubin, UA: NEGATIVE
Blood, UA: NEGATIVE
Glucose, UA: NEGATIVE mg/dL
Ketones, POC UA: NEGATIVE mg/dL
Leukocytes, UA: NEGATIVE
Nitrite, UA: NEGATIVE
POC PROTEIN,UA: NEGATIVE
Spec Grav, UA: 1.015 (ref 1.010–1.025)
Urobilinogen, UA: 0.2 U/dL
pH, UA: 6.5 (ref 5.0–8.0)

## 2024-05-23 MED ORDER — IBUPROFEN 800 MG PO TABS: 800.0000 mg | ORAL_TABLET | Freq: Three times a day (TID) | ORAL | 1 refills | Status: DC | PRN

## 2024-05-23 MED ORDER — OMEPRAZOLE 40 MG PO CPDR: 40.0000 mg | DELAYED_RELEASE_CAPSULE | Freq: Every day | ORAL | 1 refills | Status: AC

## 2024-05-23 MED ORDER — LEVALBUTEROL TARTRATE 45 MCG/ACT IN AERO: INHALATION_SPRAY | RESPIRATORY_TRACT | 5 refills | Status: AC

## 2024-05-23 MED ORDER — BREZTRI AEROSPHERE 160-9-4.8 MCG/ACT IN AERO: 2.0000 | INHALATION_SPRAY | Freq: Two times a day (BID) | RESPIRATORY_TRACT | 5 refills | Status: AC

## 2024-05-23 MED ORDER — ATORVASTATIN CALCIUM 20 MG PO TABS: 20.0000 mg | ORAL_TABLET | Freq: Every day | ORAL | 1 refills | Status: AC

## 2024-05-23 MED ORDER — SILDENAFIL CITRATE 50 MG PO TABS: 50.0000 mg | ORAL_TABLET | Freq: Every day | ORAL | 5 refills | Status: AC | PRN

## 2024-05-23 MED ORDER — PREDNISONE 20 MG PO TABS: ORAL_TABLET | ORAL | 0 refills | Status: AC

## 2024-05-23 NOTE — Progress Notes (Signed)
 Subjective:  Patient ID: Robert Mcneil, male    DOB: 1971/04/15  Age: 53 y.o. MRN: [id number]  Chief Complaint  Patient presents with   Medical Management of Chronic Issues    Hyperlipidemia    Pt with history of hyperlipidemia - pt states that he is taking lipitor 20mg  qd - due for labwork  Pt with history of GERD -currently on prilosec 40mg  qd - controls symptoms  Pt has cervical stenosis - did have surgery which relieved most of symptoms - does use ibuprofen  prn Complains of left hip pain today - says it flared up in the past week - thinks from just doing his job at work - no specific injury or trauma  Pt with sexual dysfunction and uses viagra  as needed - requests refill  Pt is a smoker and requests refill of inhalers - he is at age to do screening lung CT and would like that scheduled  Pt would like flu shot today  Pt complains of polyuria and nocturia - denies dysuria or hematuria - he does have prediabetes Will check ua today No current outpatient medications on file prior to visit.   No current facility-administered medications on file prior to visit.   Past Medical History:  Diagnosis Date   Anxiety    Depression    Past Surgical History:  Procedure Laterality Date   CERVICAL DISC ARTHROPLASTY  02/2020   KNEE SURGERY Left 2018    Family History  Problem Relation Age of Onset   Breast cancer Paternal Grandmother    Diabetes Mother    Alzheimer's disease Father    Social History   Socioeconomic History   Marital status: Married    Spouse name: Not on file   Number of children: 2   Years of education: Not on file   Highest education level: GED or equivalent  Occupational History   Occupation: Journalist, newspaper  Tobacco Use   Smoking status: Every Day    Current packs/day: 0.50    Average packs/day: 0.5 packs/day for 25.8 years (12.9 ttl pk-yrs)    Types: Cigarettes    Start date: 2000   Smokeless tobacco: Never  Vaping Use   Vaping status: Never  Used  Substance and Sexual Activity   Alcohol use: Not Currently   Drug use: Never   Sexual activity: Yes    Partners: Female  Other Topics Concern   Not on file  Social History Narrative   Not on file   Social Drivers of Health   Financial Resource Strain: Medium Risk (05/21/2024)   Overall Financial Resource Strain (CARDIA)    Difficulty of Paying Living Expenses: Somewhat hard  Food Insecurity: No Food Insecurity (05/21/2024)   Hunger Vital Sign    Worried About Running Out of Food in the Last Year: Never true    Ran Out of Food in the Last Year: Never true  Transportation Needs: No Transportation Needs (05/21/2024)   PRAPARE - Administrator, Civil Service (Medical): No    Lack of Transportation (Non-Medical): No  Physical Activity: Sufficiently Active (11/16/2023)   Exercise Vital Sign    Days of Exercise per Week: 5 days    Minutes of Exercise per Session: 30 min  Stress: No Stress Concern Present (11/16/2023)   [employer] of Occupational Health - Occupational Stress Questionnaire    Feeling of Stress : Not at all  Social Connections: Socially Integrated (05/21/2024)   Social Connection and Isolation Panel    Frequency of  Communication with Friends and Family: More than three times a week    Frequency of Social Gatherings with Friends and Family: More than three times a week    Attends Religious Services: More than 4 times per year    Active Member of Clubs or Organizations: Yes    Attends Engineer, structural: More than 4 times per year    Marital Status: Married  CONSTITUTIONAL: Negative for chills, fatigue, fever, unintentional weight gain and unintentional weight loss.  E/N/T: Negative for ear pain, nasal congestion and sore throat.  CARDIOVASCULAR: Negative for chest pain, dizziness, palpitations and pedal edema.  RESPIRATORY: Negative for recent cough and dyspnea.  GASTROINTESTINAL: Negative for abdominal pain, acid reflux symptoms,  constipation, diarrhea, nausea and vomiting.  MSK: see HPI INTEGUMENTARY: Negative for rash.  NEUROLOGICAL: Negative for dizziness and headaches.  PSYCHIATRIC: Negative for sleep disturbance and to question depression screen.  Negative for depression, negative for anhedonia.       Objective:  PHYSICAL EXAM:   VS: BP 110/70   Pulse 81   Temp 98.3 F (36.8 C) (Temporal)   Resp 18   Ht 5' 10 (1.778 m)   Wt 174 lb (78.9 kg)   SpO2 97%   BMI 24.97 kg/m   GEN: Well nourished, well developed, in no acute distress  Cardiac: RRR; no murmurs, rubs, or gallops,no edema -  Respiratory:  normal respiratory rate and pattern with no distress - normal breath sounds with no rales, rhonchi, wheezes or rubs GI: normal bowel sounds, no masses or tenderness MS: no deformity or atrophy  Skin: warm and dry, no rash  Neuro:  Alert and Oriented x 3,- CN II-Xii grossly intact Psych: euthymic mood, appropriate affect and demeanor  Office Visit on 05/23/2024  Component Date Value Ref Range Status   Color, UA 05/23/2024 yellow  yellow Final   Clarity, UA 05/23/2024 clear  clear Final   Glucose, UA 05/23/2024 negative  negative mg/dL Final   Bilirubin, UA 89/89/7974 negative  negative Final   Ketones, POC UA 05/23/2024 negative  negative mg/dL Final   Spec Grav, UA 89/89/7974 1.015  1.010 - 1.025 Final   Blood, UA 05/23/2024 negative  negative Final   pH, UA 05/23/2024 6.5  5.0 - 8.0 Final   POC PROTEIN,UA 05/23/2024 negative  negative, trace Final   Urobilinogen, UA 05/23/2024 0.2  0.2 or 1.0 E.U./dL Final   Nitrite, UA 89/89/7974 Negative  Negative Final   Leukocytes, UA 05/23/2024 Negative  Negative Final     Lab Results  Component Value Date   WBC 6.4 11/16/2023   HGB 13.9 11/16/2023   HCT 41.7 11/16/2023   PLT 278 11/16/2023   GLUCOSE 93 11/16/2023   CHOL 176 11/16/2023   TRIG 66 11/16/2023   HDL 37 (L) 11/16/2023   LDLCALC 126 (H) 11/16/2023   ALT 39 11/16/2023   AST 22  11/16/2023   NA 138 11/16/2023   K 4.4 11/16/2023   CL 103 11/16/2023   CREATININE 0.89 11/16/2023   BUN 16 11/16/2023   CO2 21 11/16/2023   TSH 2.770 10/29/2023   HGBA1C 5.9 (H) 11/16/2023      Assessment & Plan:  Hyperlipidemia Continue med Labwork pending  2. GERD - continue prilosec  3. Prediabetes Watch diet Labwork pending  4. Left hip pain Ibuprofen  as needed Rx prednisone  taper  5. Sexual dysfunction Refill viagra   6 Bronchospasm Refill xopenex  and breztril  7 Tobacco abuse Recommend stop smoking Low dose lung  CT scan  8 Need flu shot Flublok given  9 Polyuria UA normal  Meds ordered this encounter  Medications   predniSONE  (DELTASONE ) 20 MG tablet    Sig: 1 po tid for 3 days then 1 po bid for 3 days then 1 po qd for 3 days    Dispense:  18 tablet    Refill:  0    Supervising Provider:   COX, KIRSTEN [[id number]]   atorvastatin  (LIPITOR) 20 MG tablet    Sig: Take 1 tablet (20 mg total) by mouth daily.    Dispense:  90 tablet    Refill:  1    Supervising Provider:   COX, KIRSTEN [[id number]]   budesonide-glycopyrrolate-formoterol (BREZTRI  AEROSPHERE) 160-9-4.8 MCG/ACT AERO inhaler    Sig: Inhale 2 puffs into the lungs in the morning and at bedtime.    Dispense:  1 each    Refill:  5    Supervising Provider:   COX, KIRSTEN [id number]   ibuprofen  (ADVIL ) 800 MG tablet    Sig: Take 1 tablet (800 mg total) by mouth every 8 (eight) hours as needed.    Dispense:  90 tablet    Refill:  1    Supervising Provider:   COX, KIRSTEN [[id number]]   levalbuterol  (XOPENEX  HFA) 45 MCG/ACT inhaler    Sig: INHALE 2 PUFFS INTO THE LUNGS EVERY 6 HOURS AS NEEDED FOR WHEEZE    Dispense:  1 each    Refill:  5    Supervising Provider:   COX, KIRSTEN [id number]]   omeprazole  (PRILOSEC) 40 MG capsule    Sig: Take 1 capsule (40 mg total) by mouth daily.    Dispense:  90 capsule    Refill:  1    Supervising Provider:   COX, KIRSTEN [[id number]]   sildenafil  (VIAGRA ) 50 MG tablet     Sig: Take 1 tablet (50 mg total) by mouth daily as needed.    Dispense:  10 tablet    Refill:  5    Supervising Provider:   COX, KIRSTEN [phone]     Orders Placed This Encounter  Procedures   CT CHEST LUNG CANCER SCREENING LOW DOSE WO CONTRAST   Flu vaccine, recombinant, trivalent, inj   CBC with Differential/Platelet   Comprehensive metabolic panel with GFR   Lipid panel   Hemoglobin A1c   POCT URINALYSIS DIP (CLINITEK)     Follow-up: Return in about 6 months (around 11/21/2024) for chronic fasting follow-up.  An After Visit Summary was printed and given to the patient.  CAMIE JONELLE NICHOLAUS DEVONNA [facility] [phone]

## 2024-05-24 LAB — COMPREHENSIVE METABOLIC PANEL WITH GFR
ALT: 28 IU/L (ref 0–44)
AST: 20 IU/L (ref 0–40)
Albumin: 4.3 g/dL (ref 3.8–4.9)
Alkaline Phosphatase: 64 IU/L (ref 47–123)
BUN/Creatinine Ratio: 18 (ref 9–20)
BUN: 17 mg/dL (ref 6–24)
Bilirubin Total: 0.3 mg/dL (ref 0.0–1.2)
CO2: 24 mmol/L (ref 20–29)
Calcium: 9.2 mg/dL (ref 8.7–10.2)
Chloride: 105 mmol/L (ref 96–106)
Creatinine, Ser: 0.92 mg/dL (ref 0.76–1.27)
Globulin, Total: 2.2 g/dL (ref 1.5–4.5)
Glucose: 74 mg/dL (ref 70–99)
Potassium: 5.1 mmol/L (ref 3.5–5.2)
Sodium: 141 mmol/L (ref 134–144)
Total Protein: 6.5 g/dL (ref 6.0–8.5)
eGFR: 98 mL/min/1.73 (ref >59)

## 2024-05-24 LAB — CBC WITH DIFFERENTIAL/PLATELET
Basophils Absolute: 0 x10E3/uL (ref 0.0–0.2)
Basos: 1 %
EOS (ABSOLUTE): 0.2 x10E3/uL (ref 0.0–0.4)
Eos: 3 %
Hematocrit: 43 % (ref 37.5–51.0)
Hemoglobin: 14.6 g/dL (ref 13.0–17.7)
Immature Grans (Abs): 0 x10E3/uL (ref 0.0–0.1)
Immature Granulocytes: 0 %
Lymphocytes Absolute: 1.4 x10E3/uL (ref 0.7–3.1)
Lymphs: 24 %
MCH: 31.9 pg (ref 26.6–33.0)
MCHC: 34 g/dL (ref 31.5–35.7)
MCV: 94 fL (ref 79–97)
Monocytes Absolute: 0.9 x10E3/uL (ref 0.1–0.9)
Monocytes: 15 %
Neutrophils Absolute: 3.3 x10E3/uL (ref 1.4–7.0)
Neutrophils: 56 %
Platelets: 279 x10E3/uL (ref 150–450)
RBC: 4.57 x10E6/uL (ref 4.14–5.80)
RDW: 12.9 % (ref 11.6–15.4)
WBC: 5.8 x10E3/uL (ref 3.4–10.8)

## 2024-05-24 LAB — HEMOGLOBIN A1C
Est. average glucose Bld gHb Est-mCnc: 117 mg/dL
Hgb A1c MFr Bld: 5.7 % — ABNORMAL HIGH (ref 4.8–5.6)

## 2024-05-24 LAB — LIPID PANEL
Chol/HDL Ratio: 3.5 ratio (ref 0.0–5.0)
Cholesterol, Total: 145 mg/dL (ref 100–199)
HDL: 42 mg/dL (ref >39)
LDL Chol Calc (NIH): 90 mg/dL (ref 0–99)
Triglycerides: 65 mg/dL (ref 0–149)
VLDL Cholesterol Cal: 13 mg/dL (ref 5–40)

## 2024-05-26 ENCOUNTER — Ambulatory Visit: Payer: Self-pay | Admitting: Physician Assistant

## 2024-06-03 ENCOUNTER — Ambulatory Visit: Payer: Self-pay

## 2024-06-03 NOTE — Telephone Encounter (Signed)
 FYI Only or Action Required?: FYI only for provider.  Patient was last seen in primary care on 05/23/2024 by Nicholaus Credit, PA-C.  Called Nurse Triage reporting Mass.  Symptoms began x 2 weeks.  Interventions attempted: Nothing.  Symptoms are: unchanged.  Triage Disposition: See PCP When Office is Open (Within 3 Days)  Patient/caregiver understands and will follow disposition?: Yes  **Appt scheduled for 10/22**     Copied from CRM #[id number]. Topic: Clinical - Red Word Triage >> Jun 03, 2024  2:56 PM Rosaria BRAVO wrote: Red Word that prompted transfer to Nurse Triage: has a knot above groin area. Frequent urination. Seeking urology referral. Reason for Disposition  [1] Small swelling or lump AND [2] unexplained AND [3] present > 1 week  Answer Assessment - Initial Assessment Questions 1. APPEARANCE:     Small knot, no discoloration, redness  2. SIZE: How large is the swelling? (e.g., inches, cm; or compare to size of pinhead, tip of pen, eraser, coin, pea, grape, ping pong ball)       Finger tip in size   3. LOCATION: Where is the swelling located?     knot above groin area left side  4. ONSET:      X 2 weeks   5. COLOR: What color is it? Is there more than one color?     Normal tone  6. PAIN: Is there any pain? If Yes, ask: How bad is the pain? (Scale 1-10; or mild, moderate, severe)        No, swelling noted above the lump  7. ITCH: Does it itch? If Yes, ask: How bad is the itch?      No   8. CAUSE: Unsure        9 OTHER SYMPTOMS: Do you have any other symptoms? (e.g., fever)  Frequent urination during the night   Appt scheduled for 10/22 to follow up with provider.  Protocols used: Skin Lump or Localized Swelling-A-AH

## 2024-06-04 ENCOUNTER — Encounter: Payer: Self-pay | Admitting: Physician Assistant

## 2024-06-04 ENCOUNTER — Ambulatory Visit (INDEPENDENT_AMBULATORY_CARE_PROVIDER_SITE_OTHER): Admitting: Physician Assistant

## 2024-06-04 VITALS — BP 110/70 | HR 90 | Temp 97.9°F | Resp 18 | Ht 70.0 in | Wt 172.2 lb

## 2024-06-04 DIAGNOSIS — K4090 Unilateral inguinal hernia, without obstruction or gangrene, not specified as recurrent: Secondary | ICD-10-CM

## 2024-06-04 NOTE — Progress Notes (Signed)
   Acute Office Visit  Subjective:    Patient ID: Robert Mcneil, male    DOB: [birth year], 53 y.o.   MRN: [id number]  Chief Complaint  Patient presents with   Groin Swelling    HPI: Patient is in today for complaints of swelling in left groin area - he states it is not tender to touch - said it started about 1-2 weeks ago He cannot recall specific injury or when it started but does lot of heavy lifting at work He has had some urine frequency but drinks lots of water - denies dysuria   Current Outpatient Medications:    atorvastatin  (LIPITOR) 20 MG tablet, Take 1 tablet (20 mg total) by mouth daily., Disp: 90 tablet, Rfl: 1   budesonide-glycopyrrolate-formoterol (BREZTRI  AEROSPHERE) 160-9-4.8 MCG/ACT AERO inhaler, Inhale 2 puffs into the lungs in the morning and at bedtime., Disp: 1 each, Rfl: 5   ibuprofen  (ADVIL ) 800 MG tablet, Take 1 tablet (800 mg total) by mouth every 8 (eight) hours as needed., Disp: 90 tablet, Rfl: 1   levalbuterol  (XOPENEX  HFA) 45 MCG/ACT inhaler, INHALE 2 PUFFS INTO THE LUNGS EVERY 6 HOURS AS NEEDED FOR WHEEZE, Disp: 1 each, Rfl: 5   omeprazole  (PRILOSEC) 40 MG capsule, Take 1 capsule (40 mg total) by mouth daily., Disp: 90 capsule, Rfl: 1   predniSONE  (DELTASONE ) 20 MG tablet, 1 po tid for 3 days then 1 po bid for 3 days then 1 po qd for 3 days, Disp: 18 tablet, Rfl: 0   sildenafil  (VIAGRA ) 50 MG tablet, Take 1 tablet (50 mg total) by mouth daily as needed., Disp: 10 tablet, Rfl: 5  No Known Allergies  ROS CONSTITUTIONAL: Negative for chills, fatigue, fever,  CARDIOVASCULAR: Negative for chest pain, dizziness,  RESPIRATORY: Negative for recent cough and dyspnea.  GASTROINTESTINAL: Negative for abdominal pain, acid reflux symptoms, constipation, diarrhea, nausea and vomiting.  GU - see HPI      Objective:    PHYSICAL EXAM:   BP 110/70   Pulse 90   Temp 97.9 F (36.6 C) (Temporal)   Resp 18   Ht 5' 10 (1.778 m)   Wt 172 lb 3.2 oz (78.1 kg)    SpO2 96%   BMI 24.71 kg/m    GEN: Well nourished, well developed, in no acute distress  Cardiac: RRR; no murmurs, Respiratory:  normal respiratory rate and pattern with no distress - normal breath sounds with no rales, rhonchi, wheezes or rubs GI: normal bowel sounds, no masses or tenderness GU - bulge noted left inguinal area Skin: warm and dry, no rash       Assessment & Plan:    Left inguinal hernia -     Ambulatory referral to General Surgery     Follow-up: Return if symptoms worsen or fail to improve.  An After Visit Summary was printed and given to the patient.  CAMIE JONELLE NICHOLAUS DEVON[facility] [phone]

## 2024-06-06 ENCOUNTER — Ambulatory Visit (INDEPENDENT_AMBULATORY_CARE_PROVIDER_SITE_OTHER)
Admission: RE | Admit: 2024-06-06 | Discharge: 2024-06-06 | Disposition: A | Discharge status: Home / Self Care | Source: Ambulatory Visit | Attending: Physician Assistant | Admitting: Physician Assistant

## 2024-06-06 DIAGNOSIS — Z720 Tobacco use: Secondary | ICD-10-CM

## 2024-06-06 DIAGNOSIS — F17210 Nicotine dependence, cigarettes, uncomplicated: Secondary | ICD-10-CM

## 2024-06-12 ENCOUNTER — Telehealth: Payer: Self-pay

## 2024-06-12 ENCOUNTER — Other Ambulatory Visit: Payer: Self-pay

## 2024-06-12 NOTE — Telephone Encounter (Signed)
 Patient informed that CT chest showed probably benign findings. It is recommended to have a repeat Ct in 6 months. Patient verbalized understanding and did not have any questions at this time.

## 2024-06-13 ENCOUNTER — Other Ambulatory Visit: Payer: Self-pay | Admitting: Physician Assistant

## 2024-06-13 DIAGNOSIS — R918 Other nonspecific abnormal finding of lung field: Secondary | ICD-10-CM

## 2024-06-13 NOTE — Telephone Encounter (Signed)
 Robert Mcneil I thought I put per [place] in my documentation. Sorry about that.

## 2024-06-23 ENCOUNTER — Ambulatory Visit (HOSPITAL_BASED_OUTPATIENT_CLINIC_OR_DEPARTMENT_OTHER)
Admission: RE | Admit: 2024-06-23 | Discharge: 2024-06-23 | Disposition: A | Source: Ambulatory Visit | Attending: Physician Assistant | Admitting: Physician Assistant

## 2024-06-23 DIAGNOSIS — R918 Other nonspecific abnormal finding of lung field: Secondary | ICD-10-CM | POA: Diagnosis not present

## 2024-06-23 MED ORDER — FLUDEOXYGLUCOSE F - 18 (FDG) INJECTION
10.9000 | Freq: Once | INTRAVENOUS | Status: AC | PRN
  Administered 2024-06-23: 9.52 via INTRAVENOUS

## 2024-06-24 ENCOUNTER — Ambulatory Visit: Payer: Self-pay | Admitting: Physician Assistant

## 2024-08-30 ENCOUNTER — Other Ambulatory Visit: Payer: Self-pay | Admitting: Physician Assistant

## 2024-08-30 DIAGNOSIS — M25552 Pain in left hip: Secondary | ICD-10-CM
